# Patient Record
Sex: Male | Born: 1955
Health system: Southern US, Community
[De-identification: ages and names within clinical notes are randomized; demographics above are authoritative.]

## PROBLEM LIST (undated history)

## (undated) DIAGNOSIS — K2970 Gastritis, unspecified, without bleeding: Secondary | ICD-10-CM

## (undated) DIAGNOSIS — E119 Type 2 diabetes mellitus without complications: Secondary | ICD-10-CM

## (undated) DIAGNOSIS — I10 Essential (primary) hypertension: Secondary | ICD-10-CM

## (undated) DIAGNOSIS — A048 Other specified bacterial intestinal infections: Secondary | ICD-10-CM

## (undated) DIAGNOSIS — R197 Diarrhea, unspecified: Secondary | ICD-10-CM

## (undated) DIAGNOSIS — K219 Gastro-esophageal reflux disease without esophagitis: Secondary | ICD-10-CM

## (undated) HISTORY — DX: Diarrhea, unspecified: R19.7

## (undated) HISTORY — DX: Type 2 diabetes mellitus without complications: E11.9

## (undated) HISTORY — DX: Other specified bacterial intestinal infections: A04.8

## (undated) HISTORY — DX: Gastritis, unspecified, without bleeding: K29.70

## (undated) HISTORY — DX: Gastro-esophageal reflux disease without esophagitis: K21.9

---

## 2001-03-01 ENCOUNTER — Ambulatory Visit (HOSPITAL_BASED_OUTPATIENT_CLINIC_OR_DEPARTMENT_OTHER): Admission: RE | Admit: 2001-03-01 | Discharge: 2001-03-01 | Payer: Self-pay | Admitting: Otolaryngology

## 2005-08-25 ENCOUNTER — Emergency Department (HOSPITAL_COMMUNITY): Admission: EM | Admit: 2005-08-25 | Discharge: 2005-08-25 | Payer: Self-pay | Admitting: Emergency Medicine

## 2006-12-09 ENCOUNTER — Observation Stay (HOSPITAL_COMMUNITY): Admission: EM | Admit: 2006-12-09 | Discharge: 2006-12-10 | Payer: Self-pay | Admitting: Emergency Medicine

## 2008-04-23 ENCOUNTER — Encounter: Admission: RE | Admit: 2008-04-23 | Discharge: 2008-04-23 | Payer: Self-pay | Admitting: Otolaryngology

## 2010-07-12 NOTE — H&P (Signed)
Manuel Tyler, Manuel Tyler                 ACCOUNT NO.:  [id number]   MEDICAL RECORD NO.:  [id number]           PATIENT TYPE:   LOCATION:                                 FACILITY:   PHYSICIAN:  Kela Millin, M.D.DATE OF BIRTH:  October 14, 1955   DATE OF ADMISSION:  DATE OF DISCHARGE:                              HISTORY & PHYSICAL   PRIMARY CARE PHYSICIAN:  Dr. Janey Greaser.   CHIEF COMPLAINT:  Chest pain.   HISTORY OF PRESENT ILLNESS:  The patient is a 55 year old [place]  pleasant male with past medical history significant for hypertension,  who presents with complaints of chest pain.  He states that he was in  his usual state of health until about 8 o'clock last p.m. when he began  having chest pain.  He describes the pain as pressure of moderate  severity, left precordial and, it was intermittent.  He reports about  five episodes, each lasting about 30 minutes.  He admits to associated  shortness of breath.  He denies nausea and vomiting, diaphoresis,  radiation, and no paresthesias.  In the ER he was given aspirin and  nitroglycerin and he was not having any further pain at the time of my  examination.  The patient had point of care markers in the ER which were  negative, and EKG showed normal sinus rhythm with sinus arrhythmia, and  a chest x-ray showed bibasilar atelectasis with low lung volumes,  otherwise negative.  He is admitted for further evaluation and  management.   PAST MEDICAL HISTORY:  As above.   MEDICATIONS:  Metoprolol half a tablet b.i.d.   ALLERGIES:  No known drug allergies.   SOCIAL HISTORY:  He denies tobacco, also denies alcohol, and no illicit  drug use.   FAMILY HISTORY:  His sister had colon cancer at age 29.  No family  history of premature heart disease.   REVIEW OF SYSTEMS:  As per HPI.  Other review of systems negative.   LABORATORY DATA:  Chest X-Ray, EKG and point of care markers as per HPI.  Sodium is 137 with a potassium of 4.0, chloride  103, BUN of 14, glucose  96.  His creatinine is 1.0.  His pH is 7.39 with a pCO2 of 43.5.  white  cell count 8.1, hemoglobin 15.4, hematocrit of 44.5, platelet count of  181, neutrophil count 63%.   ASSESSMENT AND PLAN:  1. Chest pain:  As discussed above, will obtain serial cardiac enzymes      to evaluate for MI.  Will also obtain a D-      dimer, follow and further evaluate as appropriate.  Will place the      patient on aspirin, nitrates, continue metoprolol.  Will obtain a      fasting lipid profile in a.m. and follow.  2. Hypertension:  Continue metoprolol.      Kela Millin, M.D.  Electronically Signed     ACV/MEDQ  D:  12/09/2006  T:  12/10/2006  Job:  [id number]   cc:   Dr. Janey Greaser

## 2010-07-12 NOTE — H&P (Signed)
NAMERENLY, ROOTS                 ACCOUNT NO.:  [id number]   MEDICAL RECORD NO.:  [id number]           PATIENT TYPE:   LOCATION:                                 FACILITY:   PHYSICIAN:  Kela Millin, M.D.DATE OF BIRTH:  [birth year]   DATE OF ADMISSION:  DATE OF DISCHARGE:                              HISTORY & PHYSICAL   PRIMARY CARE PHYSICIAN:  Dr. Janey Greaser.   CHIEF COMPLAINT:  Chest pain.   HISTORY OF PRESENT ILLNESS:  The patient is a 55 year old [place]  pleasant male with past medical history significant for hypertension,  who presents with complaints of chest pain.  He states that he was in  his usual state of health until about 8 o'clock last p.m. when he began  having chest pain.  He describes the pain as pressure of moderate  severity, left precordial and, it was intermittent.  He reports about  five episodes, each lasting about 30 minutes.  He admits to associated  shortness of breath.  He denies nausea and vomiting, diaphoresis,  radiation, and no paresthesias.  In the ER he was given aspirin and  nitroglycerin and he was not having any further pain at the time of my  examination.  The patient had point of care markers in the ER which were  negative, and EKG showed normal sinus rhythm with sinus arrhythmia, and  a chest x-ray showed bibasilar atelectasis with low lung volumes,  otherwise negative.  He is admitted for further evaluation and  management.   PAST MEDICAL HISTORY:  As above.   MEDICATIONS:  Metoprolol half a tablet b.i.d.   ALLERGIES:  No known drug allergies.   SOCIAL HISTORY:  He denies tobacco, also denies alcohol, and no illicit  drug use.   FAMILY HISTORY:  His sister had colon cancer at age 77.  No family  history of premature heart disease.   REVIEW OF SYSTEMS:  As per HPI.  Other review of systems negative.   LABORATORY DATA:  Chest X-Ray, EKG and point of care markers as per HPI.  Sodium is 137 with a potassium of 4.0, chloride  103, BUN of 14, glucose  96.  His creatinine is 1.0.  His pH is 7.39 with a pCO2 of 43.5.  white  cell count 8.1, hemoglobin 15.4, hematocrit of 44.5, platelet count of  181, neutrophil count 63%.   ASSESSMENT AND PLAN:  1. Chest pain:  As discussed above, will obtain serial cardiac enzymes      to evaluate for MI.  Will also obtain a D-      dimer, follow and further evaluate as appropriate.  Will place the      patient on aspirin, nitrates, continue metoprolol.  Will obtain a      fasting lipid profile in a.m. and follow.  2. Hypertension:  Continue metoprolol.      Kela Millin, M.D.     ACV/MEDQ  D:  12/09/2006  T:  12/10/2006  Job:  [id number]   cc:   Dr. Janey Greaser

## 2010-07-15 NOTE — Op Note (Signed)
[facility]  Patient:    Manuel Tyler, Manuel Tyler Visit Number: [id number] MRN: [id number]          Service Type: DSU Location: [facility] Attending Physician:  Carlean Purl Dictated by:   Kristine Garbe Ezzard Standing, M.D. Proc. Date: 03/01/01 Admit Date:  03/01/2001 Discharge Date: 03/01/2001                             Operative Report  PREOPERATIVE DIAGNOSIS: 1. Septal deformity with nasal obstruction. 2. Turbinate hypertrophy with nasal obstruction.  POSTOPERATIVE DIAGNOSIS: 1. Septal deformity with nasal obstruction. 2. Turbinate hypertrophy with nasal obstruction.  OPERATION: Septoplasty and turbinate reduction.  ANESTHESIA: General endotracheal.  COMPLICATIONS: None.  BRIEF CLINICAL NOTE: [name] is a 55 year old gentleman who has had chronic nasal obstruction. He has repeatedly used a decongestant nasal spray in order to breath.  On examination, he has large, swollen turbinates, as well as a septal deviation.  He is taken to the operating room at this time for septoplasty and turbinate reductions.  DESCRIPTION OF PROCEDURE: After adequate endotracheal anesthesia, the patients nose was prepped with Betadine solution and draped out with sterile towels.  The nose was then further prepped with cotton pledgets soaked in decongestant solution, and the septum and turbinates were injected with Xylocaine with epinephrine for hemostasis.  A hemitransfixion incision was made along the septum on the right side.  The mucoperichondrial and mucoperiosteal flaps were elevated posteriorly.  The patient had bowing of the cartilaginous septum, as well as the bony septum protruding into the left airway.  The excess cartilaginous septum, as well as the bony protruding septum was removed after elevating the mucoperichondrial and mucoperiosteal flaps bilaterally.  This allowed the septum to return more to the midline. Following this, the turbinates were  reduced bilaterally.  The inferior one-third of the turbinate was amputated, and then bipolar cautery was used to perform submucosal cauterization, and the remaining turbinate tissue was out-fractured.  The hemitransfixion incision was closed with 4-0 chromic sutures.  The plastic splints were secured to either side of the septum with a single 3-0 nylon suture.  The nose was then placed with Gelfoam soaked in Bacitracin ointment.  Kees tolerated the procedure well and was awakened from anesthesia and transferred to the recovery room postoperatively doing well. Of note, the patient received 1 gm of Ancef IV preoperatively.  DISPOSITION:  Jadan is discharged home later this morning on Tylenol and Tylenol #3 p.r.n. pain, Keflex 500 mg b.i.d. for 1 week.  We will have him follow up in my office in one week for recheck and have his septal splints removed. Dictated by:   Kristine Garbe Ezzard Standing, M.D. Attending Physician:  Carlean Purl DD:  04/10/01 TD:  04/10/01 Job: [id number]

## 2010-12-08 LAB — LIPID PANEL
Cholesterol: 185
HDL: 27 — ABNORMAL LOW
LDL Cholesterol: 111 — ABNORMAL HIGH

## 2010-12-08 LAB — I-STAT 8, (EC8 V) (CONVERTED LAB)
Acid-Base Excess: 1
Bicarbonate: 26.5 — ABNORMAL HIGH
Chloride: 103
Potassium: 4
pCO2, Ven: 43.5 — ABNORMAL LOW

## 2010-12-08 LAB — POCT I-STAT CREATININE: Creatinine, Ser: 1

## 2010-12-08 LAB — DIFFERENTIAL
Basophils Absolute: 0
Basophils Relative: 0
Eosinophils Relative: 5
Monocytes Absolute: 0.7
Monocytes Relative: 9
Neutro Abs: 5.1

## 2010-12-08 LAB — CARDIAC PANEL(CRET KIN+CKTOT+MB+TROPI)
CK, MB: 1.6
Relative Index: 1.6
Total CK: 113
Troponin I: <0.01

## 2010-12-08 LAB — POCT CARDIAC MARKERS: Myoglobin, poc: 57.1

## 2010-12-08 LAB — CBC
HCT: 44.5
Hemoglobin: 15.4
MCHC: 34.6
Platelets: 181

## 2010-12-08 LAB — TROPONIN I: Troponin I: <0.01

## 2010-12-08 LAB — CK TOTAL AND CKMB (NOT AT ARMC): CK, MB: 2.3

## 2012-05-24 ENCOUNTER — Observation Stay (HOSPITAL_COMMUNITY)
Admission: EM | Admit: 2012-05-24 | Discharge: 2012-05-25 | Disposition: A | Discharge status: Home / Self Care | Payer: Self-pay | Attending: Internal Medicine | Admitting: Internal Medicine

## 2012-05-24 ENCOUNTER — Emergency Department (HOSPITAL_COMMUNITY): Payer: Self-pay

## 2012-05-24 ENCOUNTER — Encounter (HOSPITAL_COMMUNITY): Payer: Self-pay

## 2012-05-24 DIAGNOSIS — G459 Transient cerebral ischemic attack, unspecified: Secondary | ICD-10-CM

## 2012-05-24 DIAGNOSIS — H538 Other visual disturbances: Secondary | ICD-10-CM | POA: Insufficient documentation

## 2012-05-24 DIAGNOSIS — R29898 Other symptoms and signs involving the musculoskeletal system: Secondary | ICD-10-CM | POA: Insufficient documentation

## 2012-05-24 DIAGNOSIS — R200 Anesthesia of skin: Secondary | ICD-10-CM

## 2012-05-24 LAB — CBC WITH DIFFERENTIAL/PLATELET
Eosinophils Absolute: 0.1 10*3/uL (ref 0.0–0.7)
Eosinophils Relative: 2 % (ref 0–5)
Hemoglobin: 14.9 g/dL (ref 13.0–17.0)
Lymphs Abs: 1.8 10*3/uL (ref 0.7–4.0)
MCH: 31.6 pg (ref 26.0–34.0)
MCHC: 36.2 g/dL — ABNORMAL HIGH (ref 30.0–36.0)
Neutro Abs: 4.4 10*3/uL (ref 1.7–7.7)
Neutrophils Relative %: 66 % (ref 43–77)
RDW: 12.5 % (ref 11.5–15.5)
WBC: 6.7 10*3/uL (ref 4.0–10.5)

## 2012-05-24 LAB — POCT I-STAT TROPONIN I

## 2012-05-24 LAB — RAPID URINE DRUG SCREEN, HOSP PERFORMED
Amphetamines: NOT DETECTED
Barbiturates: NOT DETECTED
Benzodiazepines: NOT DETECTED
Cocaine: NOT DETECTED
Opiates: NOT DETECTED
Tetrahydrocannabinol: NOT DETECTED

## 2012-05-24 LAB — BASIC METABOLIC PANEL
Chloride: 101 mEq/L (ref 96–112)
GFR calc Af Amer: >90 mL/min (ref >90)
GFR calc non Af Amer: >90 mL/min (ref >90)
Potassium: 3.9 mEq/L (ref 3.5–5.1)

## 2012-05-24 LAB — TROPONIN I: Troponin I: <0.3 ng/mL (ref <0.30)

## 2012-05-24 MED ORDER — ASPIRIN EC 81 MG PO TBEC
81.0000 mg | DELAYED_RELEASE_TABLET | Freq: Every day | ORAL | Status: DC
  Administered 2012-05-24 – 2012-05-25 (×2): 81 mg via ORAL
  Filled 2012-05-24 (×2): qty 1

## 2012-05-24 MED ORDER — ENOXAPARIN SODIUM 40 MG/0.4ML ~~LOC~~ SOLN
40.0000 mg | SUBCUTANEOUS | Status: DC
  Administered 2012-05-24: 40 mg via SUBCUTANEOUS
  Filled 2012-05-24 (×2): qty 0.4

## 2012-05-24 NOTE — ED Notes (Addendum)
Per EMS: Pt at church and had sudden onset of left foot heaviness, tingling and 4th and 5th finger numbness and blurred vision in left eye at 1320. Pt fell to ground. No LOC. Pt given nitro by friend, which he states help alleviate the syx. Syx lasted appx 30 minutes before completely resolving. Complaint free at this time. Ambulates with no difficulty, denies blurred vision, AO x4. Pt went to PCP, sent here for further evaluation. VSS. 119/69. 73 SR. 100% RA. 94 BG.

## 2012-05-24 NOTE — ED Provider Notes (Signed)
History     CSN: [id number]  Arrival date & time 05/24/12  1529   First MD Initiated Contact with Patient 05/24/12 1544      Chief Complaint  Patient presents with  . Extremity Weakness    (Consider location/radiation/quality/duration/timing/severity/associated sxs/prior treatment) HPI Comments: Patient is a 57 year old male with a past medical history of hypertension who presents with an episode of extremity weakness that occurred earlier this afternoon. He reports sitting in church and having sudden onset of left foot and left hand heaviness, numbness and tingling. Patient reports he needed assistance walking at this time even though he usually walks without assistance. The symptoms lasted about 30 minutes before resolving after he took sublingual nitroglycerin. Patient reports associated blurred vision bilaterally during the episode. After symptoms resolved, patient's son took him to [facility], where his PCP is located, who then directed him to the ED. No aggravating factors.   Patient is a 57 y.o. male presenting with extremity weakness.  Extremity Weakness Associated symptoms include numbness and weakness.    History reviewed. No pertinent past medical history.  History reviewed. No pertinent past surgical history.  History reviewed. No pertinent family history.  History  Substance Use Topics  . Smoking status: Never Smoker   . Smokeless tobacco: Not on file  . Alcohol Use: No      Review of Systems  Musculoskeletal: Positive for extremity weakness.  Neurological: Positive for weakness and numbness.  All other systems reviewed and are negative.    Allergies  Review of patient's allergies indicates no known allergies.  Home Medications  No current outpatient prescriptions on file.  BP 111/63  Pulse 70  Temp(Src) 98 F (36.7 C) (Oral)  Resp 15  SpO2 100%  Physical Exam  Nursing note and vitals reviewed. Constitutional: He is oriented to person,  place, and time. He appears well-developed and well-nourished. No distress.  HENT:  Head: Normocephalic and atraumatic.  Mouth/Throat: Oropharynx is clear and moist. No oropharyngeal exudate.  Eyes: Conjunctivae and EOM are normal. Pupils are equal, round, and reactive to light. No scleral icterus.  Neck: Normal range of motion. Neck supple.  Cardiovascular: Normal rate and regular rhythm.  Exam reveals no gallop and no friction rub.   No murmur heard. Pulmonary/Chest: Effort normal and breath sounds normal. He has no wheezes. He has no rales. He exhibits no tenderness.  Abdominal: Soft. He exhibits no distension. There is no tenderness. There is no rebound and no guarding.  Musculoskeletal: Normal range of motion.  Neurological: He is alert and oriented to person, place, and time. No cranial nerve deficit. Coordination normal.  Extremity strength and sensation equal and intact bilaterally. Speech is goal-oriented. Moves limbs without ataxia.   Skin: Skin is warm and dry.  Psychiatric: He has a normal mood and affect. His behavior is normal.    ED Course  Procedures (including critical care time)   Date: 05/24/2012  Rate: 69  Rhythm: normal sinus rhythm  QRS Axis: normal  Intervals: normal  ST/T Wave abnormalities: normal  Conduction Disutrbances:none  Narrative Interpretation: NSR  Old EKG Reviewed: none available    Labs Reviewed  CBC WITH DIFFERENTIAL - Abnormal; Notable for the following:    MCHC 36.2 (*)    All other components within normal limits  BASIC METABOLIC PANEL  POCT I-STAT TROPONIN I   Ct Head Wo Contrast  05/24/2012  *RADIOLOGY REPORT*  Clinical Data: Extremity weakness. The nurse's note in emergency department states left foot  heaviness.  Blurred vision in the left eye.  CT HEAD WITHOUT CONTRAST  Technique:  Contiguous axial images were obtained from the base of the skull through the vertex without contrast.  Comparison: None.  Findings: Focal hypoattenuation  within the anterior limb of the left internal capsule may represent an age indeterminate infarct. This does not correspond with the lower left lower extremity symptoms.  No acute cortical infarct is present otherwise.  No hemorrhage or mass lesion is present.  The ventricles are of normal size.  No significant extra-axial fluid collection is present.  Mild mucosal thickening is present in the ethmoid air cells and inferior left frontal sinus.  The paranasal sinuses and mastoid air cells are otherwise clear.  IMPRESSION:  1.  Focal hypoattenuation within the anterior limb of the left internal capsule may be related to ischemia but does not correspond to the patient's acute symptoms. 2.  No other acute intracranial abnormality. 3.  Minimal left ethmoid and inferior left frontal sinus disease.   Original Report Authenticated By: Marin Roberts, M.D.      1. TIA (transient ischemic attack)       MDM  4:02 PM Labs and head CT pending.   6:14 PM CT concerning for area of focal hypoattenuation, possible ischemia. Patient will be admitted for TIA work up by Dr. Sharl Ma.       Emilia Beck, PA-C 05/24/12 [address] [place], PA-C 05/24/12 2118

## 2012-05-24 NOTE — H&P (Addendum)
PCP:   Pcp Not In System   Chief Complaint:  Left leg weakness  HPI: 69 with no significant past medical history, came to the ED after he had an episode of left leg weakness along with numbness in the little and ring finger, blurred vision which lasted for 20 minutes. Patient was [place], when this episode occurred. Patient denies slurred speech, no history of passing out, no seizure. He denies chest pain, shortness of breath, nausea vomiting or diarrhea. Patient says that he ate breakfast this morning.  Allergies:  No Known Allergies Past medical history No significant past history  Family history Patient's father has history of strokes   Prior to Admission medications   Not on File    Social History:  reports that he has never smoked. He does not have any smokeless tobacco history on file. He reports that he does not drink alcohol. His drug history is not on file.  History reviewed. No pertinent family history.  Review of Systems:  HEENT: Denies headache, , runny nose, sore throat,  Neck: Denies thyroid problems,lymphadenopathy Chest : Denies shortness of breath, no history of COPD Heart : Denies Chest pain,  coronary arterey disease GI: Denies  nausea, vomiting, diarrhea, constipation GU: Denies dysuria, urgency, frequency of urination, hematuria Neuro: Denies stroke, seizures, syncope Psych: Denies depression, anxiety, hallucinations   Physical Exam: Blood pressure 133/86, pulse 66, temperature 98 F (36.7 C), temperature source Oral, resp. rate 14, SpO2 96.00%. Constitutional:   Patient is a well-developed and well-nourished male  in no acute distress and cooperative with exam. Head: Normocephalic and atraumatic Mouth: Mucus membranes moist Eyes: PERRL, EOMI, conjunctivae normal Neck: Supple, No Thyromegaly Cardiovascular: RRR, S1 normal, S2 normal Pulmonary/Chest: CTAB, no wheezes, rales, or rhonchi Abdominal: Soft. Non-tender, non-distended, bowel  sounds are normal, no masses, organomegaly, or guarding present.  Neurological: A&O x3, Strenght is normal and symmetric bilaterally, cranial nerve II-XII are grossly intact, no focal motor deficit, sensory intact to light touch bilaterally.  Extremities : No Cyanosis, Clubbing or Edema   Labs on Admission:  Results for orders placed during the hospital encounter of 05/24/12 (from the past 48 hour(s))  CBC WITH DIFFERENTIAL     Status: Abnormal   Collection Time    05/24/12  4:39 PM      Result Value Range   WBC 6.7  4.0 - 10.5 K/uL   RBC 4.71  4.22 - 5.81 MIL/uL   Hemoglobin 14.9  13.0 - 17.0 g/dL   HCT 45.4  09.8 - 11.9 %   MCV 87.5  78.0 - 100.0 fL   MCH 31.6  26.0 - 34.0 pg   MCHC 36.2 (*) 30.0 - 36.0 g/dL   RDW 14.7  82.9 - 56.2 %   Platelets 167  150 - 400 K/uL   Neutrophils Relative 66  43 - 77 %   Neutro Abs 4.4  1.7 - 7.7 K/uL   Lymphocytes Relative 27  12 - 46 %   Lymphs Abs 1.8  0.7 - 4.0 K/uL   Monocytes Relative 5  3 - 12 %   Monocytes Absolute 0.4  0.1 - 1.0 K/uL   Eosinophils Relative 2  0 - 5 %   Eosinophils Absolute 0.1  0.0 - 0.7 K/uL   Basophils Relative 1  0 - 1 %   Basophils Absolute 0.0  0.0 - 0.1 K/uL  BASIC METABOLIC PANEL     Status: None   Collection Time  05/24/12  4:39 PM      Result Value Range   Sodium 137  135 - 145 mEq/L   Potassium 3.9  3.5 - 5.1 mEq/L   Chloride 101  96 - 112 mEq/L   CO2 25  19 - 32 mEq/L   Glucose, Bld 88  70 - 99 mg/dL   BUN 13  6 - 23 mg/dL   Creatinine, Ser 1.61  0.50 - 1.35 mg/dL   Calcium 9.1  8.4 - 09.6 mg/dL   GFR calc non Af Amer >90  >90 mL/min   GFR calc Af Amer >90  >90 mL/min   Comment:            The eGFR has been calculated     using the CKD EPI equation.     This calculation has not been     validated in all clinical     situations.     eGFR's persistently     <90 mL/min signify     possible Chronic Kidney Disease.  POCT I-STAT TROPONIN I     Status: None   Collection Time    05/24/12  5:10 PM       Result Value Range   Troponin i, poc 0.00  0.00 - 0.08 ng/mL   Comment 3            Comment: Due to the release kinetics of cTnI,     a negative result within the first hours     of the onset of symptoms does not rule out     myocardial infarction with certainty.     If myocardial infarction is still suspected,     repeat the test at appropriate intervals.    Radiological Exams on Admission: Ct Head Wo Contrast  05/24/2012  *RADIOLOGY REPORT*  Clinical Data: Extremity weakness. The nurse's note in emergency department states left foot heaviness.  Blurred vision in the left eye.  CT HEAD WITHOUT CONTRAST  Technique:  Contiguous axial images were obtained from the base of the skull through the vertex without contrast.  Comparison: None.  Findings: Focal hypoattenuation within the anterior limb of the left internal capsule may represent an age indeterminate infarct. This does not correspond with the lower left lower extremity symptoms.  No acute cortical infarct is present otherwise.  No hemorrhage or mass lesion is present.  The ventricles are of normal size.  No significant extra-axial fluid collection is present.  Mild mucosal thickening is present in the ethmoid air cells and inferior left frontal sinus.  The paranasal sinuses and mastoid air cells are otherwise clear.  IMPRESSION:  1.  Focal hypoattenuation within the anterior limb of the left internal capsule may be related to ischemia but does not correspond to the patient's acute symptoms. 2.  No other acute intracranial abnormality. 3.  Minimal left ethmoid and inferior left frontal sinus disease.   Original Report Authenticated By: Marin Roberts, M.D.     Assessment/Plan  TIA We'll admit the patient for workup of TIA under telemetry Will obtain 2-D echo, carotid Doppler Fasting lipid profile, hemoglobin A1c We'll also MRI and MRA of the brain  Left hand numbness We will obtain 3 sets of cardiac enzymes   DVT  prophylaxis Lovenox  EKG reviewed: NSR, No abnormality  Time Spent on Admission: 55 min  LAMA,GAGAN S [facility] Pager: [phone] 05/24/2012, 6:39 PM

## 2012-05-25 ENCOUNTER — Observation Stay (HOSPITAL_COMMUNITY): Payer: Self-pay

## 2012-05-25 DIAGNOSIS — R209 Unspecified disturbances of skin sensation: Secondary | ICD-10-CM

## 2012-05-25 LAB — LIPID PANEL
Cholesterol: 204 mg/dL — ABNORMAL HIGH (ref 0–200)
Total CHOL/HDL Ratio: 5.8 RATIO

## 2012-05-25 LAB — HEMOGLOBIN A1C
Hgb A1c MFr Bld: 5.6 % (ref <5.7)
Mean Plasma Glucose: 114 mg/dL (ref <117)

## 2012-05-25 LAB — TROPONIN I
Troponin I: <0.3 ng/mL (ref <0.30)
Troponin I: <0.3 ng/mL (ref <0.30)

## 2012-05-25 MED ORDER — ATORVASTATIN CALCIUM 40 MG PO TABS: 40.0000 mg | ORAL_TABLET | Freq: Every day | ORAL | Status: DC

## 2012-05-25 MED ORDER — ASPIRIN 81 MG PO TBEC: 81.0000 mg | DELAYED_RELEASE_TABLET | Freq: Every day | ORAL | Status: DC

## 2012-05-25 NOTE — Progress Notes (Signed)
  Echocardiogram 2D Echocardiogram has been performed.  Jorje Guild 05/25/2012, 2:31 PM

## 2012-05-25 NOTE — ED Provider Notes (Signed)
Shared service with midlevel provider. I have personally seen and examined the patient, providing direct face to face care, presenting with the chief complaint of numbness. Physical exam findings include non focal neuro exam. Pt's ABCD2 score is 3. Plan will be to admit for TIA workup. I have reviewed the nursing documentation on past medical history, family history, and social history.  Derwood Kaplan, MD 05/25/12 2111

## 2012-05-25 NOTE — Progress Notes (Signed)
VASCULAR LAB PRELIMINARY  PRELIMINARY  PRELIMINARY  PRELIMINARY  Carotid Dopplers completed.    Preliminary report:  There is no ICA stenosis.  Vertebral artery flow is antegrade.  Armaan Pond, RVT 05/25/2012, 3:10 PM

## 2012-05-25 NOTE — Progress Notes (Signed)
Patient c/o intermittent numbness to left toes only that lasts for a very short time and then goes away. Alerted patient that I would leave note to alert MD. Will continue to monitor.

## 2012-05-25 NOTE — Discharge Summary (Addendum)
Physician Discharge Summary  Manuel Tyler RUE:[id number] DOB: 1955/12/07 DOA: 05/24/2012  PCP: Pcp Not In System  Admit date: 05/24/2012 Discharge date: 05/25/2012  Time spent: 35 minutes  Recommendations for Outpatient Follow-up:  1. Please followup with her primary care provider next week.  Discharge Diagnoses:  Active Problems:   TIA (transient ischemic attack)  Discharge Condition: stable  Diet recommendation: heart healthy  Filed Weights   05/24/12 1933  Weight: 72 kg (158 lb 11.7 oz)   History of present illness:  19 with no significant past medical history, came to the ED after he had an episode of left leg weakness along with numbness in the little and ring finger, blurred vision which lasted for 20 minutes. Patient [place], when this episode occurred. Patient denies slurred speech, no history of passing out, no seizure. He denies chest pain, shortness of breath, nausea vomiting or diarrhea. Patient says that he ate breakfast this morning.  Hospital Course:  Patient was admitted for a TIA workup. When I interviewed the patient this morning, he says that his main numbness was in his left toes and they come and go. He underwent an MRI which showed no evidence for acute or subacute infarction however it does show ischemic changes compatible with small vessel disease. MRA showed no significant proximal stenosis. He underwent a TTE, and the results are pending at the time of this dictation. He underwent carotid duplex which was negative for stenosis. Lipid panel did show that he has elevated LDL and patient was started on a statin on discharge. He is also to start aspirin daily. Patient insisted that he be discharged today because has family coming in town. With a negative MRI, negative carotid dopplers and currently no symptoms, I will followup on the TTE once the results are back in. He was instructed to followup with his primary care provider early next  week.  Procedures:  2D echo  Consultations:  none  Discharge Exam: Filed Vitals:   05/24/12 2330 05/25/12 0256 05/25/12 0620 05/25/12 1109  BP: 129/61 126/76 117/76 128/86  Pulse: 75 60 69 61  Temp: 97.8 F (36.6 C) 97.7 F (36.5 C) 97.7 F (36.5 C) 98 F (36.7 C)  TempSrc: Oral Oral Oral Oral  Resp: 20 16 18 16   Height:      Weight:      SpO2: 99% 100% 100% 98%   General: NAD Cardiovascular: RRR Respiratory: CTA biL  Discharge Instructions     Medication List    TAKE these medications       aspirin 81 MG EC tablet  Take 1 tablet (81 mg total) by mouth daily.     atorvastatin 40 MG tablet  Commonly known as:  LIPITOR  Take 1 tablet (40 mg total) by mouth daily.           Follow-up Information   Follow up with your primary care provider In 1 week.       The results of significant diagnostics from this hospitalization (including imaging, microbiology, ancillary and laboratory) are listed below for reference.    Significant Diagnostic Studies: Ct Head Wo Contrast  05/24/2012  *RADIOLOGY REPORT*  Clinical Data: Extremity weakness. The nurse's note in emergency department states left foot heaviness.  Blurred vision in the left eye.  CT HEAD WITHOUT CONTRAST  Technique:  Contiguous axial images were obtained from the base of the skull through the vertex without contrast.  Comparison: None.  Findings: Focal hypoattenuation within the anterior  limb of the left internal capsule may represent an age indeterminate infarct. This does not correspond with the lower left lower extremity symptoms.  No acute cortical infarct is present otherwise.  No hemorrhage or mass lesion is present.  The ventricles are of normal size.  No significant extra-axial fluid collection is present.  Mild mucosal thickening is present in the ethmoid air cells and inferior left frontal sinus.  The paranasal sinuses and mastoid air cells are otherwise clear.  IMPRESSION:  1.  Focal hypoattenuation  within the anterior limb of the left internal capsule may be related to ischemia but does not correspond to the patient's acute symptoms. 2.  No other acute intracranial abnormality. 3.  Minimal left ethmoid and inferior left frontal sinus disease.   Original Report Authenticated By: Marin Roberts, M.D.    Mri Brain Without Contrast  05/25/2012  *RADIOLOGY REPORT*  Clinical Data:  TIA.  Acute onset of left lower extremity weakness yesterday.  The patient also experienced numbness in the 90 and ring finger of the left hand and blurred vision.  The symptoms lasted 20 minutes and have resolved.  MRI HEAD WITHOUT CONTRAST MRA HEAD WITHOUT CONTRAST  Technique:  Multiplanar, multiecho pulse sequences of the brain and surrounding structures were obtained without intravenous contrast. Angiographic images of the head were obtained using MRA technique without contrast.  Comparison:  CT head without contrast 05/24/2012.  MRI HEAD  Findings:  T2 hyperintensities are present within the basal ganglia bilaterally.  There is asymmetric T2 signal within the anterior limb of the left internal capsule.  Additional multifocal punctate areas of subcortical white matter disease is noted bilaterally. There are foci within the external capsule bilaterally, right greater than left.  The diffusion weighted images demonstrate no evidence for acute or subacute infarction.  The ventricles are normal size.  No significant extra-axial fluid collection is present.  Polypoid disease is noted within the posterior left nasal cavity. Asymmetric ethmoid mucosal thickening is present on the left. Mucosal thickening of the frontal maxillary sinuses is also slightly worse on the left.  The mastoid air cells are clear.  IMPRESSION:  1.  Signal changes within the basal ganglia bilaterally as well as scattered throughout the white matter are most compatible with ischemic changes of small vessel disease. 2.  The CT abnormality corresponds with  asymmetric white matter disease within the anterior limb of the left internal capsule. 3.  Mild sinus disease as described.  MRA HEAD  Findings: The internal carotid arteries are within normal limits from high cervical segments through the ICA termini bilaterally. The A1 and M1 segments are normal.  The MCA bifurcations are within normal limits bilaterally.  This the MCA branch vessels demonstrate to moderate disease, right greater than left.  The vertebral arteries are codominant.  The PICA origins are visualized and within normal limits bilaterally.  The basilar artery is normal.  Both posterior cerebral arteries originate from the basilar tip.  There is some attenuation of distal PCA branch vessels.  IMPRESSION:  1.  Moderate distal small vessel disease. 2.  No significant proximal stenosis, aneurysm, or branch vessel occlusion.   Original Report Authenticated By: Marin Roberts, M.D.    Mr Mra Head/brain Wo Cm  05/25/2012  *RADIOLOGY REPORT*  Clinical Data:  TIA.  Acute onset of left lower extremity weakness yesterday.  The patient also experienced numbness in the 90 and ring finger of the left hand and blurred vision.  The symptoms lasted 20 minutes and have  resolved.  MRI HEAD WITHOUT CONTRAST MRA HEAD WITHOUT CONTRAST  Technique:  Multiplanar, multiecho pulse sequences of the brain and surrounding structures were obtained without intravenous contrast. Angiographic images of the head were obtained using MRA technique without contrast.  Comparison:  CT head without contrast 05/24/2012.  MRI HEAD  Findings:  T2 hyperintensities are present within the basal ganglia bilaterally.  There is asymmetric T2 signal within the anterior limb of the left internal capsule.  Additional multifocal punctate areas of subcortical white matter disease is noted bilaterally. There are foci within the external capsule bilaterally, right greater than left.  The diffusion weighted images demonstrate no evidence for acute or  subacute infarction.  The ventricles are normal size.  No significant extra-axial fluid collection is present.  Polypoid disease is noted within the posterior left nasal cavity. Asymmetric ethmoid mucosal thickening is present on the left. Mucosal thickening of the frontal maxillary sinuses is also slightly worse on the left.  The mastoid air cells are clear.  IMPRESSION:  1.  Signal changes within the basal ganglia bilaterally as well as scattered throughout the white matter are most compatible with ischemic changes of small vessel disease. 2.  The CT abnormality corresponds with asymmetric white matter disease within the anterior limb of the left internal capsule. 3.  Mild sinus disease as described.  MRA HEAD  Findings: The internal carotid arteries are within normal limits from high cervical segments through the ICA termini bilaterally. The A1 and M1 segments are normal.  The MCA bifurcations are within normal limits bilaterally.  This the MCA branch vessels demonstrate to moderate disease, right greater than left.  The vertebral arteries are codominant.  The PICA origins are visualized and within normal limits bilaterally.  The basilar artery is normal.  Both posterior cerebral arteries originate from the basilar tip.  There is some attenuation of distal PCA branch vessels.  IMPRESSION:  1.  Moderate distal small vessel disease. 2.  No significant proximal stenosis, aneurysm, or branch vessel occlusion.   Original Report Authenticated By: Marin Roberts, M.D.    Labs: Basic Metabolic Panel:  Recent Labs Lab 05/24/12 1639  NA 137  K 3.9  CL 101  CO2 25  GLUCOSE 88  BUN 13  CREATININE 0.90  CALCIUM 9.1   CBC:  Recent Labs Lab 05/24/12 1639  WBC 6.7  NEUTROABS 4.4  HGB 14.9  HCT 41.2  MCV 87.5  PLT 167   Cardiac Enzymes:  Recent Labs Lab 05/24/12 2037 05/25/12 0109 05/25/12 1057  TROPONINI <0.30 <0.30 <0.30    Signed:  Pamella Pert  [facility] 05/25/2012,  3:36 PM

## 2012-05-25 NOTE — Progress Notes (Signed)
UR completed 

## 2012-05-26 NOTE — Progress Notes (Signed)
No NCM needs identified. Pt did not request any assistance with medications. Isidoro Donning RN CCM Case Mgmt phone [phone]

## 2014-05-05 IMAGING — CT CT HEAD W/O CM
1 series · 15 of 30 positions shown, 19 images · non-contrast
Comparison: None.

CLINICAL DATA: Extremity weakness. The nurse's note in emergency
department states left foot heaviness.  Blurred vision in the left
eye.

CT HEAD WITHOUT CONTRAST
TECHNIQUE: Contiguous axial images were obtained from the base of
the skull through the vertex without contrast.

[Series 2: head routine 4.8 h37s · axial · 0.48mm/px · z∈[-94,+39]mm · 15 of 30 slices shown, 19 images]
[im 2/30  brain]
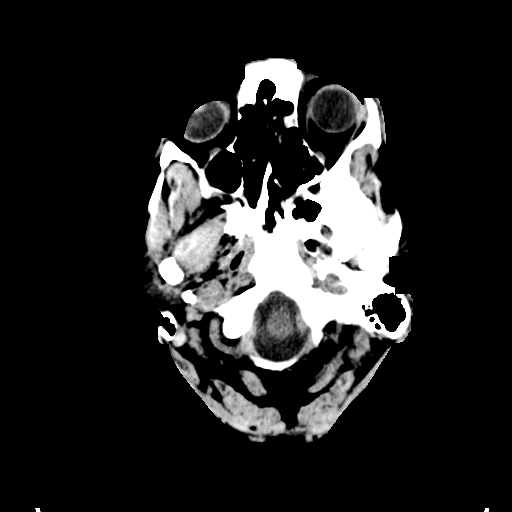
[im 2/30  bone]
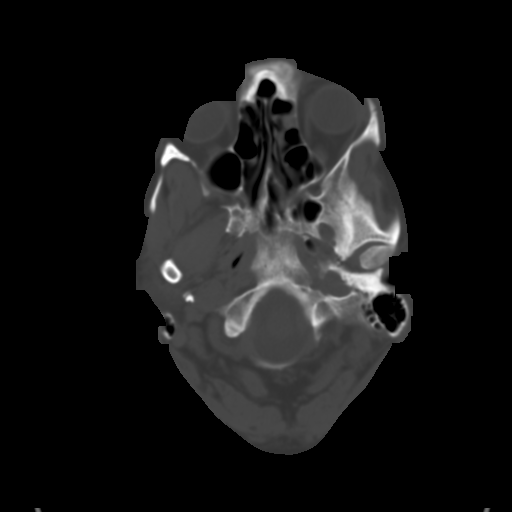
[im 4/30  brain]
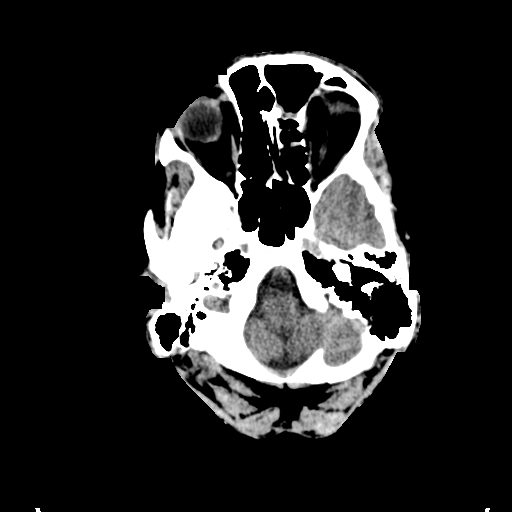
[im 6/30  brain]
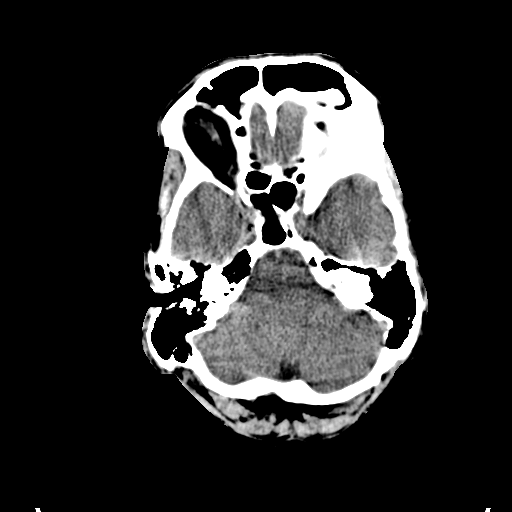
[im 8/30  brain]
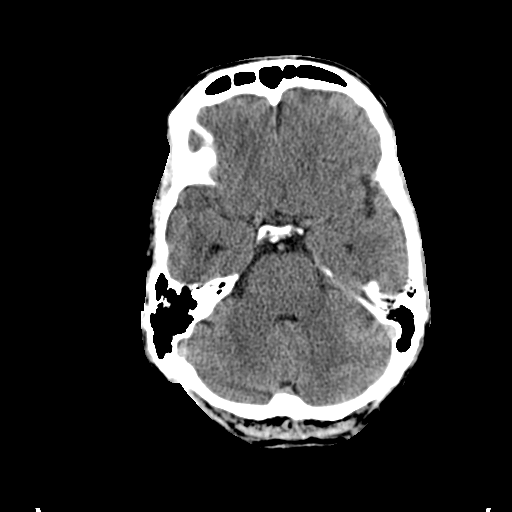
[im 10/30  brain]
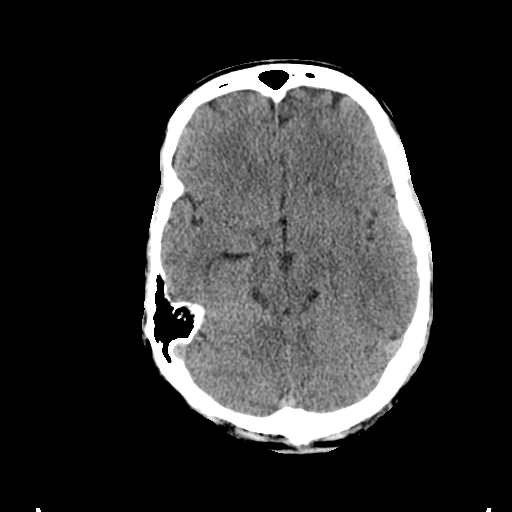
[im 10/30  bone]
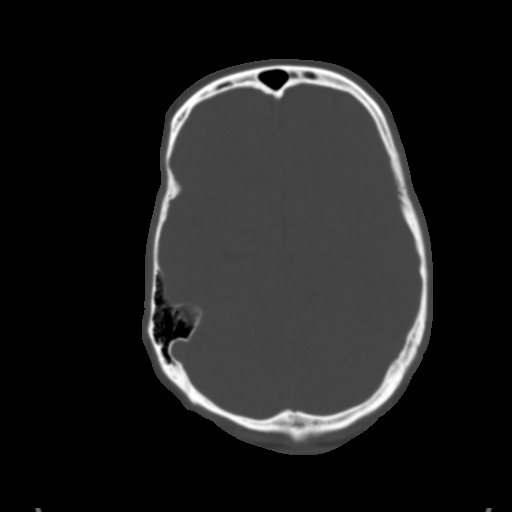
[im 12/30  brain]
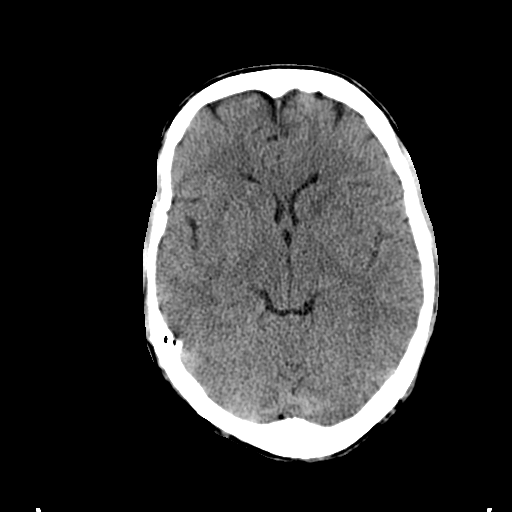
[im 14/30  brain]
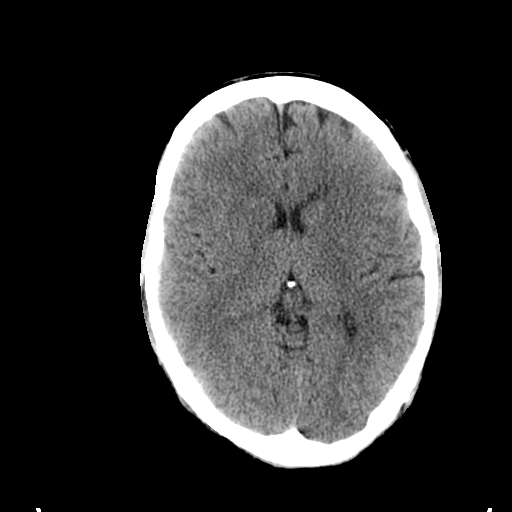
[im 16/30  brain]
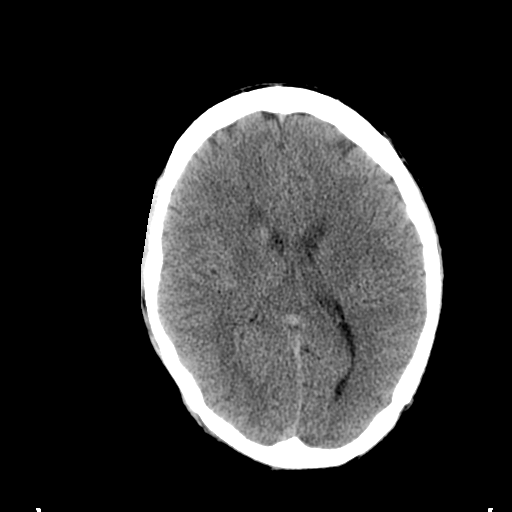
[im 17/30  brain]
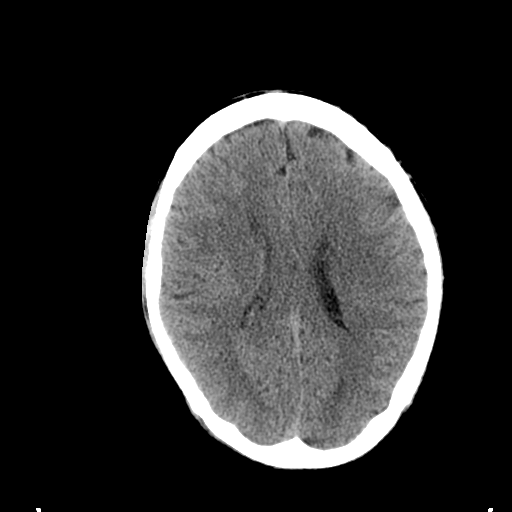
[im 17/30  bone]
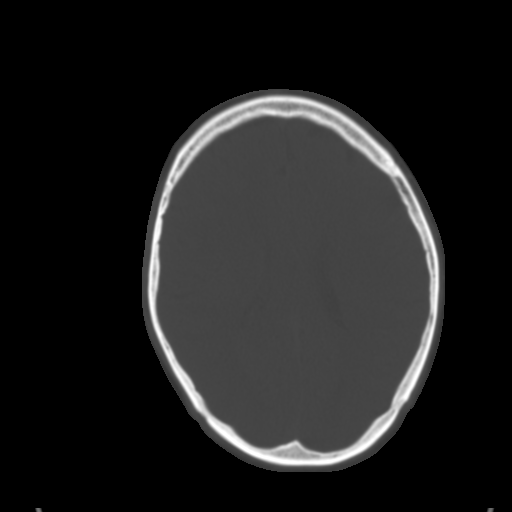
[im 19/30  brain]
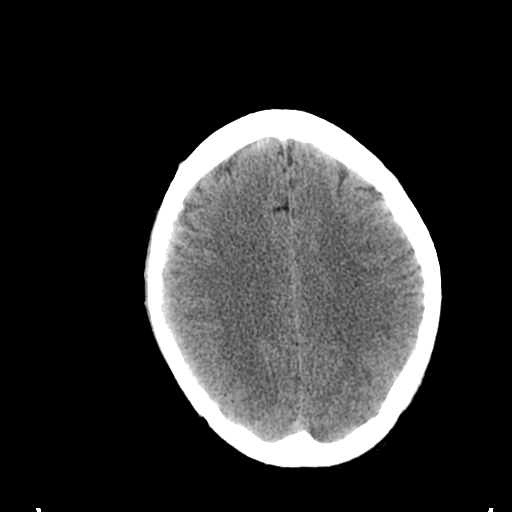
[im 21/30  brain]
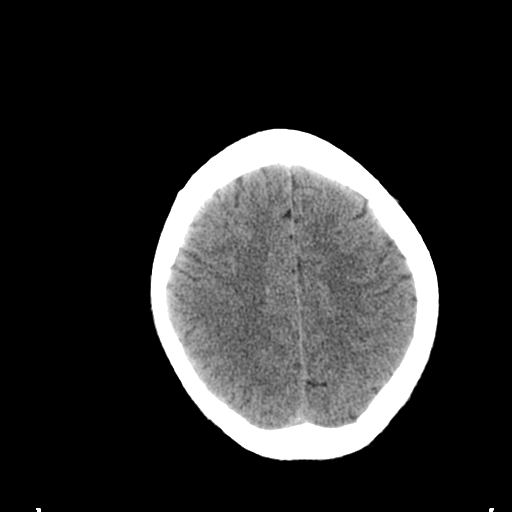
[im 23/30  brain]
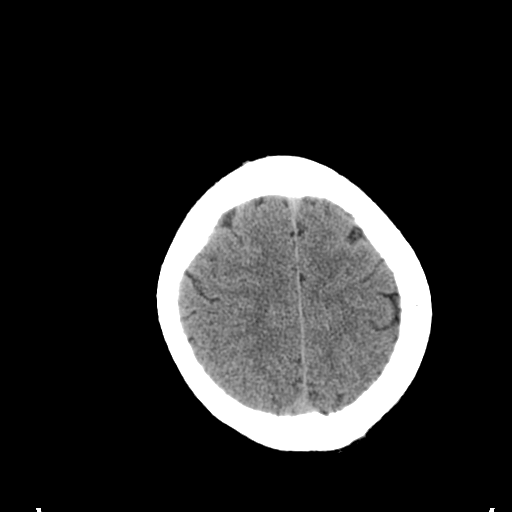
[im 25/30  brain]
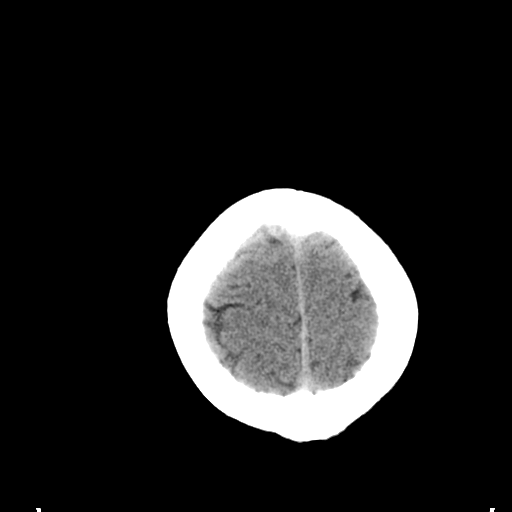
[im 25/30  bone]
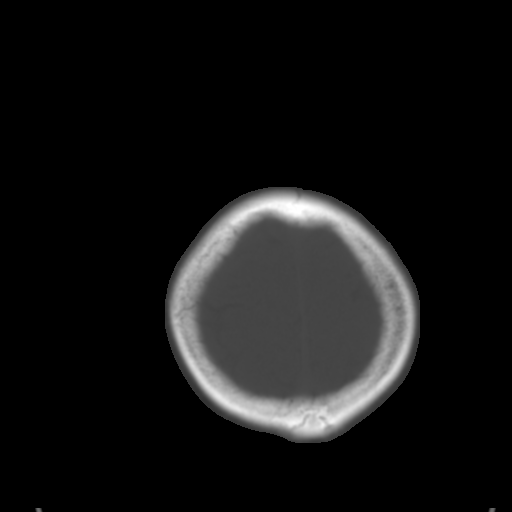
[im 27/30  brain]
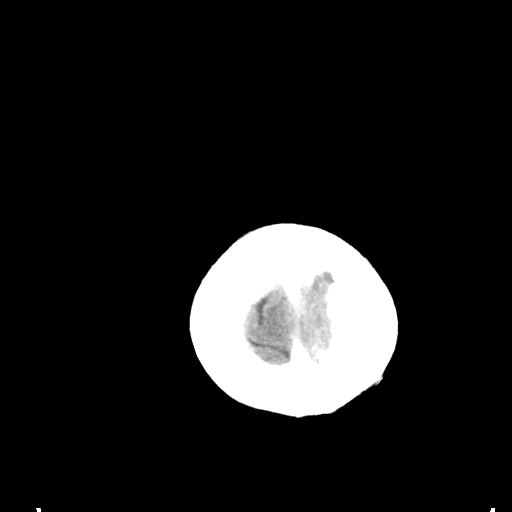
[im 29/30  brain]
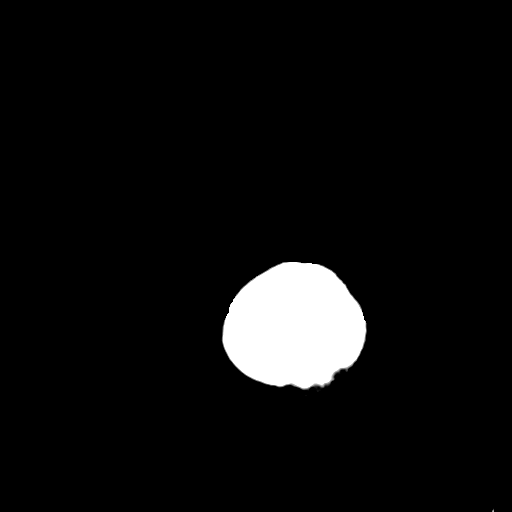

[15 of 30 positions shown; findings below may reference images not displayed]

FINDINGS: Focal hypoattenuation within the anterior limb of the
left internal capsule may represent an age indeterminate infarct.
This does not correspond with the lower left lower extremity
symptoms.

No acute cortical infarct is present otherwise.  No hemorrhage or
mass lesion is present.  The ventricles are of normal size.  No
significant extra-axial fluid collection is present.  Mild mucosal
thickening is present in the ethmoid air cells and inferior left
frontal sinus.  The paranasal sinuses and mastoid air cells are
otherwise clear.
IMPRESSION: 1.  Focal hypoattenuation within the anterior limb of the left
internal capsule may be related to ischemia but does not correspond
to the patient's acute symptoms.
2.  No other acute intracranial abnormality.
3.  Minimal left ethmoid and inferior left frontal sinus disease.

## 2017-06-26 DIAGNOSIS — I10 Essential (primary) hypertension: Secondary | ICD-10-CM | POA: Diagnosis not present

## 2018-02-04 DIAGNOSIS — L0201 Cutaneous abscess of face: Secondary | ICD-10-CM | POA: Diagnosis not present

## 2018-09-04 DIAGNOSIS — L738 Other specified follicular disorders: Secondary | ICD-10-CM | POA: Diagnosis not present

## 2018-09-06 DIAGNOSIS — L988 Other specified disorders of the skin and subcutaneous tissue: Secondary | ICD-10-CM | POA: Diagnosis not present

## 2018-10-08 DIAGNOSIS — L738 Other specified follicular disorders: Secondary | ICD-10-CM | POA: Diagnosis not present

## 2018-11-28 ENCOUNTER — Emergency Department (HOSPITAL_BASED_OUTPATIENT_CLINIC_OR_DEPARTMENT_OTHER)
Admission: EM | Admit: 2018-11-28 | Discharge: 2018-11-28 | Disposition: A | Discharge status: Home / Self Care | Payer: BC Managed Care – PPO | Attending: Emergency Medicine | Admitting: Emergency Medicine

## 2018-11-28 ENCOUNTER — Encounter (HOSPITAL_BASED_OUTPATIENT_CLINIC_OR_DEPARTMENT_OTHER): Payer: Self-pay

## 2018-11-28 ENCOUNTER — Other Ambulatory Visit: Payer: Self-pay

## 2018-11-28 ENCOUNTER — Emergency Department (HOSPITAL_BASED_OUTPATIENT_CLINIC_OR_DEPARTMENT_OTHER): Payer: BC Managed Care – PPO

## 2018-11-28 DIAGNOSIS — R519 Headache, unspecified: Secondary | ICD-10-CM | POA: Diagnosis not present

## 2018-11-28 DIAGNOSIS — I160 Hypertensive urgency: Secondary | ICD-10-CM | POA: Insufficient documentation

## 2018-11-28 DIAGNOSIS — Z7982 Long term (current) use of aspirin: Secondary | ICD-10-CM | POA: Diagnosis not present

## 2018-11-28 DIAGNOSIS — I10 Essential (primary) hypertension: Secondary | ICD-10-CM | POA: Diagnosis not present

## 2018-11-28 DIAGNOSIS — H538 Other visual disturbances: Secondary | ICD-10-CM | POA: Insufficient documentation

## 2018-11-28 DIAGNOSIS — Z8673 Personal history of transient ischemic attack (TIA), and cerebral infarction without residual deficits: Secondary | ICD-10-CM | POA: Diagnosis not present

## 2018-11-28 DIAGNOSIS — Z79899 Other long term (current) drug therapy: Secondary | ICD-10-CM | POA: Insufficient documentation

## 2018-11-28 HISTORY — DX: Essential (primary) hypertension: I10

## 2018-11-28 LAB — CBC WITH DIFFERENTIAL/PLATELET
Abs Immature Granulocytes: 0.02 10*3/uL (ref 0.00–0.07)
Basophils Absolute: 0 10*3/uL (ref 0.0–0.1)
Basophils Relative: 0 %
Eosinophils Absolute: 0.2 10*3/uL (ref 0.0–0.5)
Eosinophils Relative: 3 %
HCT: 45.5 % (ref 39.0–52.0)
Hemoglobin: 15.3 g/dL (ref 13.0–17.0)
Immature Granulocytes: 0 %
Lymphocytes Relative: 30 %
Lymphs Abs: 2.1 10*3/uL (ref 0.7–4.0)
MCH: 30.7 pg (ref 26.0–34.0)
MCHC: 33.6 g/dL (ref 30.0–36.0)
MCV: 91.4 fL (ref 80.0–100.0)
Monocytes Absolute: 0.6 10*3/uL (ref 0.1–1.0)
Monocytes Relative: 8 %
Neutro Abs: 4.2 10*3/uL (ref 1.7–7.7)
Neutrophils Relative %: 59 %
Platelets: 200 10*3/uL (ref 150–400)
RBC: 4.98 MIL/uL (ref 4.22–5.81)
RDW: 12.3 % (ref 11.5–15.5)
WBC: 7.1 10*3/uL (ref 4.0–10.5)
nRBC: 0 % (ref 0.0–0.2)

## 2018-11-28 LAB — URINALYSIS, ROUTINE W REFLEX MICROSCOPIC
Bilirubin Urine: NEGATIVE
Glucose, UA: NEGATIVE mg/dL
Ketones, ur: NEGATIVE mg/dL
Leukocytes,Ua: NEGATIVE
Nitrite: NEGATIVE
Protein, ur: NEGATIVE mg/dL
Specific Gravity, Urine: 1.01 (ref 1.005–1.030)
pH: 7 (ref 5.0–8.0)

## 2018-11-28 LAB — BASIC METABOLIC PANEL
Anion gap: 9 (ref 5–15)
BUN: 11 mg/dL (ref 8–23)
CO2: 27 mmol/L (ref 22–32)
Calcium: 9 mg/dL (ref 8.9–10.3)
Chloride: 99 mmol/L (ref 98–111)
Creatinine, Ser: 0.81 mg/dL (ref 0.61–1.24)
GFR calc Af Amer: >60 mL/min (ref >60)
GFR calc non Af Amer: >60 mL/min (ref >60)
Glucose, Bld: 123 mg/dL — ABNORMAL HIGH (ref 70–99)
Potassium: 3.9 mmol/L (ref 3.5–5.1)
Sodium: 135 mmol/L (ref 135–145)

## 2018-11-28 LAB — URINALYSIS, MICROSCOPIC (REFLEX): WBC, UA: NONE SEEN WBC/hpf (ref 0–5)

## 2018-11-28 NOTE — Discharge Instructions (Signed)
You were seen in the ED today for high blood pressure Your labwork and CT scan of your head were all very reassuring - you may be experiencing headaches due to elevations in your blood pressure Continue taking your blood pressure medication as prescribed until you can see your PCP as scheduled on Tuesday

## 2018-11-28 NOTE — ED Provider Notes (Signed)
[facility] DEPART[facility] Provider Note   CSN: [id number] Arrival date & time: 11/28/18  1801     History   Chief Complaint Chief Complaint  Patient presents with  . Hypertension    HPI Pau Banh is a 63 y.o. male with PMHx HTN (currently on antihypertensive although cannot recall name) who presents to the ED today complaining of increase in blood pressure for the past month.  Patient also reports intermittent headaches and blurry vision in his right eye for the past month although reports it was more severe today.  He states that his blood pressure today was 180/110 prompting the ED visit.  They do have a follow-up with her PCP next Tuesday.  Reports that he has not had any increase in his blood pressure medication in some years.  Does also endorse that he has been having some nosebleeds recently.  Per wife patient saw a specialist for the nosebleeds recently and it was thought that it was due to his high blood pressure pressure.  Denies fever, chills, neck stiffness, rash, speech difficulties, confusion, unilateral weakness or numbness, nausea, vomiting, any other associated symptoms.     The history is provided by the patient and the spouse. The history is limited by a language barrier.    Past Medical History:  Diagnosis Date  . Hypertension     Patient Active Problem List   Diagnosis Date Noted  . TIA (transient ischemic attack) 05/24/2012    History reviewed. No pertinent surgical history.      Home Medications    Prior to Admission medications   Medication Sig Start Date End Date Taking? Authorizing Provider  aspirin EC 81 MG EC tablet Take 1 tablet (81 mg total) by mouth daily. 05/25/12   Caren Griffins, MD    Family History No family history on file.  Social History Social History   Tobacco Use  . Smoking status: Never Smoker  . Smokeless tobacco: Never Used  Substance Use Topics  . Alcohol use: No  . Drug use: Never      Allergies   Patient has no known allergies.   Review of Systems Review of Systems  Constitutional: Negative for chills and fever.  HENT: Negative for congestion.   Eyes: Positive for visual disturbance.  Respiratory: Negative for cough and shortness of breath.   Cardiovascular: Negative for chest pain.  Gastrointestinal: Negative for abdominal pain, nausea and vomiting.  Genitourinary: Negative for difficulty urinating.  Musculoskeletal: Negative for neck pain and neck stiffness.  Skin: Negative for rash.  Neurological: Positive for headaches. Negative for syncope, weakness and numbness.     Physical Exam Updated Vital Signs BP (!) 167/95 (BP Location: Right Arm)   Pulse 68   Temp 98.9 F (37.2 C) (Oral)   Resp 14   Ht 5\' 6"  (1.676 m)   Wt 73.9 kg   SpO2 99%   BMI 26.31 kg/m   Physical Exam Vitals signs and nursing note reviewed.  Constitutional:      Appearance: He is not ill-appearing.  HENT:     Head: Normocephalic and atraumatic.     Right Ear: Tympanic membrane normal.     Left Ear: Tympanic membrane normal.  Eyes:     Extraocular Movements: Extraocular movements intact.     Conjunctiva/sclera: Conjunctivae normal.     Pupils: Pupils are equal, round, and reactive to light.     Comments: Visual Acuity Bilateral Distance:20/20 (Patient wears prescription glasses. Has them on at this  time.) R Distance:20/40 L Distance:20/20  Neck:     Musculoskeletal: Normal range of motion and neck supple. No neck rigidity.     Meningeal: Brudzinski's sign and Kernig's sign absent.  Cardiovascular:     Rate and Rhythm: Normal rate and regular rhythm.     Pulses: Normal pulses.  Pulmonary:     Effort: Pulmonary effort is normal.     Breath sounds: Normal breath sounds. No wheezing, rhonchi or rales.  Abdominal:     Palpations: Abdomen is soft.     Tenderness: There is no abdominal tenderness. There is no guarding or rebound.  Skin:    General: Skin is warm and dry.   Neurological:     Mental Status: He is alert.     Comments: CN 3-12 grossly intact A&O x4 GCS 15 Sensation and strength intact Gait nonataxic including with tandem walking Coordination with finger-to-nose WNL Neg romberg, neg pronator drift      ED Treatments / Results  Labs (all labs ordered are listed, but only abnormal results are displayed) Labs Reviewed  BASIC METABOLIC PANEL - Abnormal; Notable for the following components:      Result Value   Glucose, Bld 123 (*)    All other components within normal limits  URINALYSIS, ROUTINE W REFLEX MICROSCOPIC - Abnormal; Notable for the following components:   Hgb urine dipstick TRACE (*)    All other components within normal limits  URINALYSIS, MICROSCOPIC (REFLEX) - Abnormal; Notable for the following components:   Bacteria, UA RARE (*)    All other components within normal limits  CBC WITH DIFFERENTIAL/PLATELET    EKG None  Radiology Ct Head Wo Contrast  Result Date: 11/28/2018 CLINICAL DATA:  Headache, hypertension EXAM: CT HEAD WITHOUT CONTRAST TECHNIQUE: Contiguous axial images were obtained from the base of the skull through the vertex without intravenous contrast. COMPARISON:  CT head 05/24/2012 FINDINGS: Brain: No evidence of acute infarction, hemorrhage, hydrocephalus, extra-axial collection or mass lesion/mass effect. Symmetric mild prominence of the ventricles, cisterns and sulci compatible with parenchymal volume loss. Patchy areas of white matter hypoattenuation are most compatible with chronic microvascular angiopathy. Vascular: Atherosclerotic calcification of the carotid siphons. No hyperdense vessel. Skull: No calvarial fracture or suspicious osseous lesion. No scalp swelling or hematoma. Sinuses/Orbits: Paranasal sinuses and mastoid air cells are predominantly clear. Pneumatization of the petrous apices. Included orbital structures are unremarkable. Other: Non IMPRESSION: No acute intracranial abnormality.  Electronically Signed   By: Kreg Shropshire M.D.   On: 11/28/2018 20:47    Procedures Procedures (including critical care time)  Medications Ordered in ED Medications - No data to display   Initial Impression / Assessment and Plan / ED Course  I have reviewed the triage vital signs and the nursing notes.  Pertinent labs & imaging results that were available during my care of the patient were reviewed by me and considered in my medical decision making (see chart for details).    63 year old male who presents the ED today with complaints of elevated blood pressure intermittently for the past month, headaches, vision changes in the right eye.  Has been on same blood pressure medication for multiple years.  They are unable to say what kind of medication he is taking currently and unable to recognize any names.  They have a follow-up visit with her PCP on Tuesday.   Patient's blood pressure in the ED 167/95 although they report a reading of 180/110 today.  No focal neuro deficits on exam today.  Patient is afebrile without tachycardia or tachypnea.  There are no meningeal signs today.  Given complaint of blurry vision in right eye will obtain visual acuity.  Will obtain baseline screening labs and obtain CT head given complaint of headache and age above 1660 that is new for patient.   Visual acuity with decreased vision in right eye compared to left - it appears pt recently had his prescription changed prior to the blurry vision starting in right eye - have advised follow up with optometrist for recheck of prescription.   CT negative. Bloodwork reassuring today - no electrolyte derangements and normal creatinine. U/A without infection or dehydration. Will discharge patient home at this time - have discussed continuation of blood pressure medication until PCP follow up as scheduled on Tuesday. Strict return precautions have been discussed. Pt and wife are in agreement with plan. All questions were  answered prior to discharge.   This note was prepared using Dragon voice recognition software and may include unintentional dictation errors due to the inherent limitations of voice recognition software.        Final Clinical Impressions(s) / ED Diagnoses   Final diagnoses:  Essential hypertension  Hypertensive urgency    ED Discharge Orders    None       Tanda RockersVenter, Toriana Sponsel, PA-C 11/28/18 2252    Rolan BuccoBelfi, Melanie, MD 11/28/18 2321

## 2018-11-28 NOTE — ED Triage Notes (Addendum)
Pt has a hx of HTN. Pt reports a B/P of 180/110. Pt reports only symptoms of HA. Pt speaks "[place]" and has his wife in triage to translate.

## 2018-12-23 DIAGNOSIS — I10 Essential (primary) hypertension: Secondary | ICD-10-CM | POA: Diagnosis not present

## 2018-12-23 DIAGNOSIS — R197 Diarrhea, unspecified: Secondary | ICD-10-CM | POA: Diagnosis not present

## 2018-12-24 DIAGNOSIS — I10 Essential (primary) hypertension: Secondary | ICD-10-CM | POA: Diagnosis not present

## 2018-12-24 DIAGNOSIS — R197 Diarrhea, unspecified: Secondary | ICD-10-CM | POA: Diagnosis not present

## 2018-12-31 DIAGNOSIS — I10 Essential (primary) hypertension: Secondary | ICD-10-CM | POA: Diagnosis not present

## 2018-12-31 DIAGNOSIS — K219 Gastro-esophageal reflux disease without esophagitis: Secondary | ICD-10-CM | POA: Diagnosis not present

## 2018-12-31 DIAGNOSIS — Z20828 Contact with and (suspected) exposure to other viral communicable diseases: Secondary | ICD-10-CM | POA: Diagnosis not present

## 2018-12-31 DIAGNOSIS — R197 Diarrhea, unspecified: Secondary | ICD-10-CM | POA: Diagnosis not present

## 2019-04-14 ENCOUNTER — Emergency Department (HOSPITAL_COMMUNITY): Payer: Self-pay

## 2019-04-14 ENCOUNTER — Other Ambulatory Visit: Payer: Self-pay

## 2019-04-14 ENCOUNTER — Emergency Department (HOSPITAL_COMMUNITY)
Admission: EM | Admit: 2019-04-14 | Discharge: 2019-04-14 | Disposition: A | Discharge status: Home / Self Care | Payer: Self-pay | Attending: Emergency Medicine | Admitting: Emergency Medicine

## 2019-04-14 DIAGNOSIS — R0789 Other chest pain: Secondary | ICD-10-CM | POA: Insufficient documentation

## 2019-04-14 DIAGNOSIS — I10 Essential (primary) hypertension: Secondary | ICD-10-CM | POA: Insufficient documentation

## 2019-04-14 DIAGNOSIS — Z7982 Long term (current) use of aspirin: Secondary | ICD-10-CM | POA: Insufficient documentation

## 2019-04-14 LAB — TROPONIN I (HIGH SENSITIVITY)
Troponin I (High Sensitivity): 18 ng/L — ABNORMAL HIGH (ref <18)
Troponin I (High Sensitivity): 14 ng/L (ref <18)

## 2019-04-14 LAB — BASIC METABOLIC PANEL
Anion gap: 8 (ref 5–15)
BUN: 12 mg/dL (ref 8–23)
CO2: 21 mmol/L — ABNORMAL LOW (ref 22–32)
Calcium: 8.4 mg/dL — ABNORMAL LOW (ref 8.9–10.3)
Chloride: 105 mmol/L (ref 98–111)
Creatinine, Ser: 0.82 mg/dL (ref 0.61–1.24)
GFR calc Af Amer: >60 mL/min (ref >60)
GFR calc non Af Amer: >60 mL/min (ref >60)
Glucose, Bld: 126 mg/dL — ABNORMAL HIGH (ref 70–99)
Potassium: 4.2 mmol/L (ref 3.5–5.1)
Sodium: 134 mmol/L — ABNORMAL LOW (ref 135–145)

## 2019-04-14 LAB — D-DIMER, QUANTITATIVE: D-Dimer, Quant: 0.31 ug/mL-FEU (ref 0.00–0.50)

## 2019-04-14 LAB — CBC
HCT: 38.8 % — ABNORMAL LOW (ref 39.0–52.0)
Hemoglobin: 13 g/dL (ref 13.0–17.0)
MCH: 31 pg (ref 26.0–34.0)
MCHC: 33.5 g/dL (ref 30.0–36.0)
MCV: 92.6 fL (ref 80.0–100.0)
Platelets: 164 10*3/uL (ref 150–400)
RBC: 4.19 MIL/uL — ABNORMAL LOW (ref 4.22–5.81)
RDW: 12.3 % (ref 11.5–15.5)
WBC: 8.7 10*3/uL (ref 4.0–10.5)
nRBC: 0 % (ref 0.0–0.2)

## 2019-04-14 MED ORDER — ACETAMINOPHEN 500 MG PO TABS
1000.0000 mg | ORAL_TABLET | Freq: Once | ORAL | Status: AC
  Administered 2019-04-14: 05:00:00 1000 mg via ORAL
  Filled 2019-04-14: qty 2

## 2019-04-14 MED ORDER — MORPHINE SULFATE (PF) 4 MG/ML IV SOLN
4.0000 mg | Freq: Once | INTRAVENOUS | Status: AC
  Administered 2019-04-14: 02:00:00 4 mg via INTRAVENOUS
  Filled 2019-04-14: qty 1

## 2019-04-14 MED ORDER — PANTOPRAZOLE SODIUM 40 MG IV SOLR
40.0000 mg | Freq: Once | INTRAVENOUS | Status: AC
  Administered 2019-04-14: 04:00:00 40 mg via INTRAVENOUS
  Filled 2019-04-14: qty 40

## 2019-04-14 MED ORDER — ONDANSETRON HCL 4 MG/2ML IJ SOLN
4.0000 mg | Freq: Once | INTRAMUSCULAR | Status: AC
  Administered 2019-04-14: 02:00:00 4 mg via INTRAVENOUS
  Filled 2019-04-14: qty 2

## 2019-04-14 MED ORDER — ALUM & MAG HYDROXIDE-SIMETH 200-200-20 MG/5ML PO SUSP
30.0000 mL | Freq: Once | ORAL | Status: AC
  Administered 2019-04-14: 04:00:00 30 mL via ORAL
  Filled 2019-04-14: qty 30

## 2019-04-14 MED ORDER — LIDOCAINE VISCOUS HCL 2 % MT SOLN
15.0000 mL | Freq: Once | OROMUCOSAL | Status: AC
  Administered 2019-04-14: 04:00:00 15 mL via ORAL
  Filled 2019-04-14: qty 15

## 2019-04-14 MED ORDER — SODIUM CHLORIDE 0.9% FLUSH: 3.0000 mL | Freq: Once | INTRAVENOUS | Status: DC

## 2019-04-14 MED ORDER — KETOROLAC TROMETHAMINE 30 MG/ML IJ SOLN
30.0000 mg | Freq: Once | INTRAMUSCULAR | Status: AC
  Administered 2019-04-14: 05:00:00 30 mg via INTRAVENOUS
  Filled 2019-04-14: qty 1

## 2019-04-14 NOTE — ED Triage Notes (Signed)
Pt. BIB [facility] from home c/o CP starting at 9 pm yesterday while resting. Pt CP left side rating at 10 out of 10, no radiation, no cardiac HX, HX HTN. Additional S/S: SOB, pain when taking in a deep breath.   EMS gave 324 ASA and NTG X3. Pt states improvement of pain rating 4 out of 10.

## 2019-04-14 NOTE — ED Provider Notes (Signed)
TIME SEEN: 1:41 AM  CHIEF COMPLAINT: Chest pain  HPI: Patient is a 64 year old male with history of hypertension who presents to the emergency department with pressure-like left-sided chest pain without radiation.  Symptoms started at 7 PM tonight while at home at rest.  Pain initially was a 2/10 - 3/10.  Patient reports 2 h later pain increased to a 7/10.  He reports at 11 PM the pain became a 10/10 and he called 911.  EMS gave aspirin and 3 nitroglycerin tablets and pain is now a 4/10.  He states pain is worse with inspiration and he feels short of breath.  No nausea, vomiting, diaphoresis or dizziness.  No calf swelling or calf tenderness.  No history of diabetes, hyperlipidemia, tobacco use, CAD, PE or DVT.  States this does not feel like indigestion.  Reports he ate pizza for lunch and 2 biscuits and banana for dinner.  He states that he was already having the symptoms when he ate his dinner tonight.  States yesterday he was able to do strenuous activity in his yard and never had any chest pain or shortness of breath.  He denies any known injury to the chest.  He denies fevers, cough.  No known Covid exposures.  ROS: See HPI Constitutional: no fever  Eyes: no drainage  ENT: no runny nose   Cardiovascular:  chest pain  Resp:  SOB  GI: no vomiting GU: no dysuria Integumentary: no rash  Allergy: no hives  Musculoskeletal: no leg swelling  Neurological: no slurred speech ROS otherwise negative  PAST MEDICAL HISTORY/PAST SURGICAL HISTORY:  Past Medical History:  Diagnosis Date  . Hypertension     MEDICATIONS:  Prior to Admission medications   Medication Sig Start Date End Date Taking? Authorizing Provider  aspirin EC 81 MG EC tablet Take 1 tablet (81 mg total) by mouth daily. 05/25/12   Leatha Gilding, MD    ALLERGIES:  No Known Allergies  SOCIAL HISTORY:  Social History   Tobacco Use  . Smoking status: Never Smoker  . Smokeless tobacco: Never Used  Substance Use Topics  .  Alcohol use: No    FAMILY HISTORY: No family history on file.  EXAM: BP 124/86 (BP Location: Right Arm)   Pulse 63   Temp 98 F (36.7 C) (Oral)   Resp 10   Ht 5' 3.78" (1.62 m)   Wt 76.2 kg   SpO2 99%   BMI 29.04 kg/m  CONSTITUTIONAL: Alert and oriented and responds appropriately to questions. Well-appearing; well-nourished HEAD: Normocephalic EYES: Conjunctivae clear, pupils appear equal, EOM appear intact ENT: normal nose; moist mucous membranes NECK: Supple, normal ROM CARD: RRR; S1 and S2 appreciated; no murmurs, no clicks, no rubs, no gallops CHEST:  Chest wall is nontender to palpation.  No crepitus, ecchymosis, erythema, warmth, rash or other lesions present.   RESP: Normal chest excursion without splinting or tachypnea; breath sounds clear and equal bilaterally; no wheezes, no rhonchi, no rales, no hypoxia or respiratory distress, speaking full sentences ABD/GI: Normal bowel sounds; non-distended; soft, non-tender, no rebound, no guarding, no peritoneal signs, no hepatosplenomegaly BACK:  The back appears normal EXT: Normal ROM in all joints; no deformity noted, no edema; no cyanosis, no calf tenderness or calf swelling SKIN: Normal color for age and race; warm; no rash on exposed skin NEURO: Moves all extremities equally PSYCH: The patient's mood and manner are appropriate.   MEDICAL DECISION MAKING: Patient here with chest pain.  EKG shows mild elevation in  anterior leads which appears similar to previous.  His HEAR score is 3.  Will obtain cardiac labs including D-dimer given he is having some pleuritic component.  Will give morphine, Zofran to get patient chest pain-free.  ED PROGRESS: First troponin is eighteen.  D-dimer negative.  Chest x-ray clear.  No improvement in pain after morphine.  Will give GI cocktail, Protonix and reassess.  Second troponin pending.   4:30 AM  Pt's chest pain has not improved with GI cocktail and Protonix.  His second troponin is flat and  is 14.  This seems very atypical for ACS especially given he was able to exert himself with strenuous outdoor labor yesterday without any symptoms.  On my reevaluation, he now seems to have some left chest wall tenderness.  I suspect this is musculoskeletal in nature.  He reports morphine made him feel weird, dizzy.  Will give Toradol and reassess.   5:15 AM  Pt's chest pain improved with Toradol.  Again this seems very atypical and I feel he is safe to be discharged.  Recommended PCP follow-up.  Reports still having headache after nitroglycerin.  Will give Tylenol prior to discharge.  Vital signs within normal limits here.  Patient comfortable with this plan.   At this time, I do not feel there is any life-threatening condition present. I have reviewed, interpreted and discussed all results (EKG, imaging, lab, urine as appropriate) and exam findings with patient/family. I have reviewed nursing notes and appropriate previous records.  I feel the patient is safe to be discharged home without further emergent workup and can continue workup as an outpatient as needed. Discussed usual and customary return precautions. Patient/family verbalize understanding and are comfortable with this plan.  Outpatient follow-up has been provided as needed. All questions have been answered.    EKG Interpretation  Date/Time:  Monday April 14 2019 01:33:09 EST Ventricular Rate:  62 PR Interval:    QRS Duration: 87 QT Interval:  396 QTC Calculation: 403 R Axis:   50 Text Interpretation: Sinus rhythm Borderline ST elevation, anterior leads No significant change since last tracing since 2008 Confirmed by Regla Fitzgibbon, Cyril Mourning [id number]) on 04/14/2019 1:41:21 AM          Manuel Tyler was evaluated in Emergency Department on 04/14/2019 for the symptoms described in the history of present illness. He was evaluated in the context of the global COVID-19 pandemic, which necessitated consideration that the patient might be at risk  for infection with the SARS-CoV-2 virus that causes COVID-19. Institutional protocols and algorithms that pertain to the evaluation of patients at risk for COVID-19 are in a state of rapid change based on information released by regulatory bodies including the CDC and federal and state organizations. These policies and algorithms were followed during the patient's care in the ED.  Patient was seen wearing N95, face shield, gloves.    Euan Wandler, Delice Bison, DO 04/14/19 [phone]

## 2019-04-14 NOTE — Discharge Instructions (Addendum)
You may alternate Tylenol 1000 mg every 6 hours as needed for pain, fever and Ibuprofen 800 mg every 8 hours as needed for pain, fever.  Please take Ibuprofen with food.  Do not take more than 4000 mg of Tylenol (acetaminophen) in a 24 hour period.   Your cardiac labs, EKG, chest x-ray today were normal.  You also had a blood test called a D-dimer that was negative that rules out blood clots.  Your pain may be musculoskeletal in nature.  There is no sign of any life-threatening illness present today.  Please follow-up with your primary care physician.  If you have worsening symptoms of chest pain or shortness of breath, feel like you are going to pass out, begin suddenly sweating, high fever of 100.4 or higher, please return to the emergency department.

## 2019-05-14 DIAGNOSIS — K2970 Gastritis, unspecified, without bleeding: Secondary | ICD-10-CM | POA: Diagnosis not present

## 2019-05-16 DIAGNOSIS — R739 Hyperglycemia, unspecified: Secondary | ICD-10-CM | POA: Diagnosis not present

## 2019-06-09 DIAGNOSIS — K2970 Gastritis, unspecified, without bleeding: Secondary | ICD-10-CM | POA: Diagnosis not present

## 2019-06-09 DIAGNOSIS — B9681 Helicobacter pylori [H. pylori] as the cause of diseases classified elsewhere: Secondary | ICD-10-CM | POA: Diagnosis not present

## 2019-06-09 DIAGNOSIS — R197 Diarrhea, unspecified: Secondary | ICD-10-CM | POA: Diagnosis not present

## 2019-06-09 DIAGNOSIS — K219 Gastro-esophageal reflux disease without esophagitis: Secondary | ICD-10-CM | POA: Diagnosis not present

## 2019-08-20 DIAGNOSIS — Z1159 Encounter for screening for other viral diseases: Secondary | ICD-10-CM | POA: Diagnosis not present

## 2019-08-25 DIAGNOSIS — R1013 Epigastric pain: Secondary | ICD-10-CM | POA: Diagnosis not present

## 2019-08-25 DIAGNOSIS — R12 Heartburn: Secondary | ICD-10-CM | POA: Diagnosis not present

## 2019-08-25 DIAGNOSIS — R197 Diarrhea, unspecified: Secondary | ICD-10-CM | POA: Diagnosis not present

## 2019-08-25 DIAGNOSIS — K3189 Other diseases of stomach and duodenum: Secondary | ICD-10-CM | POA: Diagnosis not present

## 2019-09-11 ENCOUNTER — Ambulatory Visit (INDEPENDENT_AMBULATORY_CARE_PROVIDER_SITE_OTHER): Payer: Self-pay | Admitting: Otolaryngology

## 2019-09-11 ENCOUNTER — Other Ambulatory Visit: Payer: Self-pay

## 2019-09-16 ENCOUNTER — Ambulatory Visit (INDEPENDENT_AMBULATORY_CARE_PROVIDER_SITE_OTHER): Payer: BC Managed Care – PPO | Admitting: Otolaryngology

## 2019-09-16 ENCOUNTER — Encounter (INDEPENDENT_AMBULATORY_CARE_PROVIDER_SITE_OTHER): Payer: Self-pay | Admitting: Otolaryngology

## 2019-09-16 ENCOUNTER — Other Ambulatory Visit: Payer: Self-pay

## 2019-09-16 VITALS — Temp 97.2°F

## 2019-09-16 DIAGNOSIS — H6122 Impacted cerumen, left ear: Secondary | ICD-10-CM | POA: Diagnosis not present

## 2019-09-16 DIAGNOSIS — H60312 Diffuse otitis externa, left ear: Secondary | ICD-10-CM

## 2019-09-16 NOTE — Progress Notes (Signed)
HPI: Manuel Tyler is a 65 y.o. male who returns today for evaluation of left ear symptoms.  He has pain and discomfort and decreased hearing on the left side.  He had a tube placed in the left ear in 2018.  He is having no active drainage.  Past Medical History:  Diagnosis Date  . Hypertension    No past surgical history on file. Social History   Socioeconomic History  . Marital status: Married    Spouse name: Not on file  . Number of children: Not on file  . Years of education: Not on file  . Highest education level: Not on file  Occupational History  . Not on file  Tobacco Use  . Smoking status: Never Smoker  . Smokeless tobacco: Never Used  Vaping Use  . Vaping Use: Never used  Substance and Sexual Activity  . Alcohol use: No  . Drug use: Never  . Sexual activity: Not on file  Other Topics Concern  . Not on file  Social History Narrative  . Not on file   Social Determinants of Health   Financial Resource Strain:   . Difficulty of Paying Living Expenses:   Food Insecurity:   . Worried About Programme researcher, broadcasting/film/video in the Last Year:   . Barista in the Last Year:   Transportation Needs:   . Freight forwarder (Medical):   Marland Kitchen Lack of Transportation (Non-Medical):   Physical Activity:   . Days of Exercise per Week:   . Minutes of Exercise per Session:   Stress:   . Feeling of Stress :   Social Connections:   . Frequency of Communication with Friends and Family:   . Frequency of Social Gatherings with Friends and Family:   . Attends Religious Services:   . Active Member of Clubs or Organizations:   . Attends Banker Meetings:   Marland Kitchen Marital Status:    No family history on file. No Known Allergies Prior to Admission medications   Medication Sig Start Date End Date Taking? Authorizing Provider  aspirin EC 81 MG EC tablet Take 1 tablet (81 mg total) by mouth daily. 05/25/12  Yes Leatha Gilding, MD     Positive ROS: Otherwise negative  All  other systems have been reviewed and were otherwise negative with the exception of those mentioned in the HPI and as above.  Physical Exam: Constitutional: Alert, well-appearing, no acute distress Ears: External ears without lesions or tenderness.  Right ear canal and right TM are clear.  Left ear canal reveals swollen left ear canal consistent with external otitis along with an extruded myringotomy tube within the ear canal and debris that was cleaned with suction.  After cleaning the ear canal I applied Ciprodex eardrops to the left ear. Nasal: External nose without lesions. Septum with mild deformity and mild rhinitis.. Clear nasal passages Oral: Lips and gums without lesions. Tongue and palate mucosa without lesions. Posterior oropharynx clear. Neck: No palpable adenopathy or masses Respiratory: Breathing comfortably  Skin: No facial/neck lesions or rash noted.  Cerumen impaction removal  Date/Time: 09/16/2019 5:37 PM Performed by: Drema Halon, MD Authorized by: Drema Halon, MD   Consent:    Consent obtained:  Verbal   Consent given by:  Patient   Risks discussed:  Pain and bleeding Procedure details:    Location:  L ear   Procedure type: suction and forceps   Post-procedure details:    Inspection:  TM  intact and canal normal   Hearing quality:  Improved   Patient tolerance of procedure:  Tolerated well, no immediate complications Comments:     Left ear canal was cleaned with suction and forceps.  Patient had extruded left myringotomy tube as well as inflammatory changes of the ear canal.  After cleaning the ear canal I applied Ciprodex drops to the left ear.    Assessment: Extruded left myringotomy tube with acute left otitis externa  Plan: I applied Ciprodex drops in the office today and prescribed Ciprodex 4 to 5 drops left ear twice daily for 5 days and Augmentin 875 twice daily for 1 week.   Narda Bonds, MD

## 2019-09-30 ENCOUNTER — Ambulatory Visit (INDEPENDENT_AMBULATORY_CARE_PROVIDER_SITE_OTHER): Payer: Self-pay | Admitting: Otolaryngology

## 2019-10-06 ENCOUNTER — Other Ambulatory Visit: Payer: Self-pay

## 2019-10-06 ENCOUNTER — Ambulatory Visit (INDEPENDENT_AMBULATORY_CARE_PROVIDER_SITE_OTHER): Payer: BC Managed Care – PPO | Admitting: Otolaryngology

## 2019-10-06 ENCOUNTER — Encounter (INDEPENDENT_AMBULATORY_CARE_PROVIDER_SITE_OTHER): Payer: Self-pay | Admitting: Otolaryngology

## 2019-10-06 VITALS — Temp 97.2°F

## 2019-10-06 DIAGNOSIS — H60312 Diffuse otitis externa, left ear: Secondary | ICD-10-CM

## 2019-10-06 NOTE — Progress Notes (Signed)
HPI: Manuel Tyler is a 64 y.o. male who returns today for evaluation of painful and blocked left ear for the past week.  The ear canal was cleaned about 3 weeks ago and had slight inflammatory changes.  At that time he was treated with Augmentin and Ciprodex eardrops..  Past Medical History:  Diagnosis Date  . Hypertension    No past surgical history on file. Social History   Socioeconomic History  . Marital status: Married    Spouse name: Not on file  . Number of children: Not on file  . Years of education: Not on file  . Highest education level: Not on file  Occupational History  . Not on file  Tobacco Use  . Smoking status: Never Smoker  . Smokeless tobacco: Never Used  Vaping Use  . Vaping Use: Never used  Substance and Sexual Activity  . Alcohol use: No  . Drug use: Never  . Sexual activity: Not on file  Other Topics Concern  . Not on file  Social History Narrative  . Not on file   Social Determinants of Health   Financial Resource Strain:   . Difficulty of Paying Living Expenses:   Food Insecurity:   . Worried About Programme researcher, broadcasting/film/video in the Last Year:   . Barista in the Last Year:   Transportation Needs:   . Freight forwarder (Medical):   Marland Kitchen Lack of Transportation (Non-Medical):   Physical Activity:   . Days of Exercise per Week:   . Minutes of Exercise per Session:   Stress:   . Feeling of Stress :   Social Connections:   . Frequency of Communication with Friends and Family:   . Frequency of Social Gatherings with Friends and Family:   . Attends Religious Services:   . Active Member of Clubs or Organizations:   . Attends Banker Meetings:   Marland Kitchen Marital Status:    No family history on file. No Known Allergies Prior to Admission medications   Medication Sig Start Date End Date Taking? Authorizing Provider  aspirin EC 81 MG EC tablet Take 1 tablet (81 mg total) by mouth daily. 05/25/12  Yes Leatha Gilding, MD     Positive  ROS: Otherwise negative  All other systems have been reviewed and were otherwise negative with the exception of those mentioned in the HPI and as above.  Physical Exam: Constitutional: Alert, well-appearing, no acute distress Ears: External ears without lesions or tenderness.  Right ear canal and TM are clear.  Left ear canal is moderately inflamed with white debris as well as fungal elements in the left ear canal consistent with a fungal left external otitis.  Ear canal was copiously cleaned with hydroperoxide and alcohol after cleaning the ear canal I applied gentian violet, Ciprodex and CSF powder. Nasal: External nose without lesions.. Clear nasal passages Oral: Lips and gums without lesions. Tongue and palate mucosa without lesions. Posterior oropharynx clear. Neck: No palpable adenopathy or masses Respiratory: Breathing comfortably  Skin: No facial/neck lesions or rash noted.  Cerumen impaction removal  Date/Time: 10/06/2019 4:37 PM Performed by: Drema Halon, MD Authorized by: Drema Halon, MD   Consent:    Consent obtained:  Verbal   Consent given by:  Patient   Risks discussed:  Pain and bleeding Procedure details:    Location:  L ear   Procedure type: suction   Post-procedure details:    Inspection:  TM intact  Hearing quality:  Improved   Patient tolerance of procedure:  Tolerated well, no immediate complications Comments:     Right ear canal and TM are clear. Left ear canal is occluded with debris and fungal elements that was cleaned with suction.  Findings consistent with left external otitis.    Assessment: Left external otitis  Plan: Ear canal was cleaned in the office today and I applied gentian violet, Ciprodex and CSF powder.  He will follow-up in 5 days for recheck.  Recommend keeping the ear dry.   Narda Bonds, MD

## 2019-10-10 ENCOUNTER — Ambulatory Visit (INDEPENDENT_AMBULATORY_CARE_PROVIDER_SITE_OTHER): Payer: Self-pay | Admitting: Otolaryngology

## 2019-10-10 ENCOUNTER — Encounter (INDEPENDENT_AMBULATORY_CARE_PROVIDER_SITE_OTHER): Payer: Self-pay | Admitting: Otolaryngology

## 2019-10-10 ENCOUNTER — Other Ambulatory Visit: Payer: Self-pay

## 2019-10-10 VITALS — Temp 97.7°F

## 2019-10-10 DIAGNOSIS — H60312 Diffuse otitis externa, left ear: Secondary | ICD-10-CM

## 2019-10-10 NOTE — Progress Notes (Signed)
HPI: Manuel Tyler is a 64 y.o. male who returns today for evaluation of acute fungal left external otitis.  He is doing much better with no further pain and hearing is 75% better..  Past Medical History:  Diagnosis Date  . Hypertension    No past surgical history on file. Social History   Socioeconomic History  . Marital status: Married    Spouse name: Not on file  . Number of children: Not on file  . Years of education: Not on file  . Highest education level: Not on file  Occupational History  . Not on file  Tobacco Use  . Smoking status: Never Smoker  . Smokeless tobacco: Never Used  Vaping Use  . Vaping Use: Never used  Substance and Sexual Activity  . Alcohol use: No  . Drug use: Never  . Sexual activity: Not on file  Other Topics Concern  . Not on file  Social History Narrative  . Not on file   Social Determinants of Health   Financial Resource Strain:   . Difficulty of Paying Living Expenses:   Food Insecurity:   . Worried About Programme researcher, broadcasting/film/video in the Last Year:   . Barista in the Last Year:   Transportation Needs:   . Freight forwarder (Medical):   Marland Kitchen Lack of Transportation (Non-Medical):   Physical Activity:   . Days of Exercise per Week:   . Minutes of Exercise per Session:   Stress:   . Feeling of Stress :   Social Connections:   . Frequency of Communication with Friends and Family:   . Frequency of Social Gatherings with Friends and Family:   . Attends Religious Services:   . Active Member of Clubs or Organizations:   . Attends Banker Meetings:   Marland Kitchen Marital Status:    No family history on file. No Known Allergies Prior to Admission medications   Medication Sig Start Date End Date Taking? Authorizing Provider  aspirin EC 81 MG EC tablet Take 1 tablet (81 mg total) by mouth daily. 05/25/12  Yes Leatha Gilding, MD     Positive ROS: Otherwise negative  All other systems have been reviewed and were otherwise  negative with the exception of those mentioned in the HPI and as above.  Physical Exam: Constitutional: Alert, well-appearing, no acute distress Ears: External ears without lesions or tenderness.  Left ear canal with gentian violet within the ear canal and no fungal elements.  No significant swelling.  The TM is clear with gentian violet on it.  No drainage noted and no pain in the ear canal today.  I reapplied some gentian violet to the lateral portion ear canal and some CSF powder. Nasal: External nose without lesion. Clear nasal passages Oral: Lips and gums without lesions. Tongue and palate mucosa without lesions. Posterior oropharynx clear. Neck: No palpable adenopathy or masses Respiratory: Breathing comfortably  Skin: No facial/neck lesions or rash noted.  Procedures  Assessment: Resolving left external otitis  Plan: Recommend keeping the ear dry.  He has eardrops to use if he develops any further ear pain. He will follow-up as needed.   Narda Bonds, MD

## 2019-10-14 ENCOUNTER — Ambulatory Visit (INDEPENDENT_AMBULATORY_CARE_PROVIDER_SITE_OTHER): Payer: Self-pay | Admitting: Otolaryngology

## 2019-10-22 DIAGNOSIS — I10 Essential (primary) hypertension: Secondary | ICD-10-CM | POA: Diagnosis not present

## 2019-10-22 DIAGNOSIS — K2970 Gastritis, unspecified, without bleeding: Secondary | ICD-10-CM | POA: Diagnosis not present

## 2019-10-22 DIAGNOSIS — R7303 Prediabetes: Secondary | ICD-10-CM | POA: Diagnosis not present

## 2019-11-18 ENCOUNTER — Encounter (INDEPENDENT_AMBULATORY_CARE_PROVIDER_SITE_OTHER): Payer: Self-pay | Admitting: Otolaryngology

## 2019-11-18 ENCOUNTER — Other Ambulatory Visit: Payer: Self-pay

## 2019-11-18 ENCOUNTER — Ambulatory Visit (INDEPENDENT_AMBULATORY_CARE_PROVIDER_SITE_OTHER): Payer: BC Managed Care – PPO | Admitting: Otolaryngology

## 2019-11-18 VITALS — Temp 95.2°F

## 2019-11-18 DIAGNOSIS — H60312 Diffuse otitis externa, left ear: Secondary | ICD-10-CM | POA: Diagnosis not present

## 2019-11-18 NOTE — Progress Notes (Signed)
HPI: Manuel Tyler is a 64 y.o. male who returns today for evaluation of recurrent left external otitis.  He has been having drainage from his ear again.  He was last seen on 10/10/2019.Marland Kitchen  Past Medical History:  Diagnosis Date  . Hypertension    No past surgical history on file. Social History   Socioeconomic History  . Marital status: Married    Spouse name: Not on file  . Number of children: Not on file  . Years of education: Not on file  . Highest education level: Not on file  Occupational History  . Not on file  Tobacco Use  . Smoking status: Never Smoker  . Smokeless tobacco: Never Used  Vaping Use  . Vaping Use: Never used  Substance and Sexual Activity  . Alcohol use: No  . Drug use: Never  . Sexual activity: Not on file  Other Topics Concern  . Not on file  Social History Narrative  . Not on file   Social Determinants of Health   Financial Resource Strain:   . Difficulty of Paying Living Expenses: Not on file  Food Insecurity:   . Worried About Programme researcher, broadcasting/film/video in the Last Year: Not on file  . Ran Out of Food in the Last Year: Not on file  Transportation Needs:   . Lack of Transportation (Medical): Not on file  . Lack of Transportation (Non-Medical): Not on file  Physical Activity:   . Days of Exercise per Week: Not on file  . Minutes of Exercise per Session: Not on file  Stress:   . Feeling of Stress : Not on file  Social Connections:   . Frequency of Communication with Friends and Family: Not on file  . Frequency of Social Gatherings with Friends and Family: Not on file  . Attends Religious Services: Not on file  . Active Member of Clubs or Organizations: Not on file  . Attends Banker Meetings: Not on file  . Marital Status: Not on file   No family history on file. No Known Allergies Prior to Admission medications   Medication Sig Start Date End Date Taking? Authorizing Provider  aspirin EC 81 MG EC tablet Take 1 tablet (81 mg total)  by mouth daily. 05/25/12  Yes Leatha Gilding, MD     Positive ROS: Otherwise negative  All other systems have been reviewed and were otherwise negative with the exception of those mentioned in the HPI and as above.  Physical Exam: Constitutional: Alert, well-appearing, no acute distress Ears: External ears without lesions or tenderness.  Right ear canal right TM are clear.  Left ear canal reveals drainage with a medial external otitis with drainage and debris deep within the ear canal that was cleaned with suction.  After copiously irrigating and cleaning the ear with hydroperoxide and alcohol I applied gentian violet, Ciprodex and CSF powder to the left ear only. Nasal: External nose without lesions. Clear nasal passages Oral: Lips and gums without lesions. Tongue and palate mucosa without lesions. Posterior oropharynx clear. Neck: No palpable adenopathy or masses Respiratory: Breathing comfortably  Skin: No facial/neck lesions or rash noted.  Cerumen impaction removal  Date/Time: 11/18/2019 1:24 PM Performed by: Drema Halon, MD Authorized by: Drema Halon, MD   Consent:    Consent obtained:  Verbal   Consent given by:  Patient   Risks discussed:  Pain and bleeding Procedure details:    Location:  L ear   Procedure type: suction  Post-procedure details:    Inspection:  TM intact and canal normal   Hearing quality:  Improved   Patient tolerance of procedure:  Tolerated well, no immediate complications Comments:     Patient with debris and left external otitis.  After cleaning the ear canal I applied gentian violet, Ciprodex and CSF powder to the left ear.    Assessment: Chronic left external otitis  Plan: The left ear was cleaned in the office today.  I applied gentian violet Ciprodex and CSF powder to the left ear.  Recommended keeping it dry and he will follow-up in 1 week for recheck.   Narda Bonds, MD

## 2019-11-25 ENCOUNTER — Other Ambulatory Visit: Payer: Self-pay

## 2019-11-25 ENCOUNTER — Ambulatory Visit (INDEPENDENT_AMBULATORY_CARE_PROVIDER_SITE_OTHER): Payer: BC Managed Care – PPO | Admitting: Otolaryngology

## 2019-11-25 VITALS — Temp 97.5°F

## 2019-11-25 DIAGNOSIS — H60312 Diffuse otitis externa, left ear: Secondary | ICD-10-CM

## 2019-11-25 NOTE — Progress Notes (Signed)
HPI: Manuel Tyler is a 64 y.o. male who returns today for evaluation of left external otitis.  He has been doing well the past week.  Occasionally has some itching.  But no drainage..  Past Medical History:  Diagnosis Date  . Hypertension    No past surgical history on file. Social History   Socioeconomic History  . Marital status: Married    Spouse name: Not on file  . Number of children: Not on file  . Years of education: Not on file  . Highest education level: Not on file  Occupational History  . Not on file  Tobacco Use  . Smoking status: Never Smoker  . Smokeless tobacco: Never Used  Vaping Use  . Vaping Use: Never used  Substance and Sexual Activity  . Alcohol use: No  . Drug use: Never  . Sexual activity: Not on file  Other Topics Concern  . Not on file  Social History Narrative  . Not on file   Social Determinants of Health   Financial Resource Strain:   . Difficulty of Paying Living Expenses: Not on file  Food Insecurity:   . Worried About Programme researcher, broadcasting/film/video in the Last Year: Not on file  . Ran Out of Food in the Last Year: Not on file  Transportation Needs:   . Lack of Transportation (Medical): Not on file  . Lack of Transportation (Non-Medical): Not on file  Physical Activity:   . Days of Exercise per Week: Not on file  . Minutes of Exercise per Session: Not on file  Stress:   . Feeling of Stress : Not on file  Social Connections:   . Frequency of Communication with Friends and Family: Not on file  . Frequency of Social Gatherings with Friends and Family: Not on file  . Attends Religious Services: Not on file  . Active Member of Clubs or Organizations: Not on file  . Attends Banker Meetings: Not on file  . Marital Status: Not on file   No family history on file. No Known Allergies Prior to Admission medications   Medication Sig Start Date End Date Taking? Authorizing Provider  aspirin EC 81 MG EC tablet Take 1 tablet (81 mg total)  by mouth daily. 05/25/12   Leatha Gilding, MD     Positive ROS: Otherwise negative  All other systems have been reviewed and were otherwise negative with the exception of those mentioned in the HPI and as above.  Physical Exam: Constitutional: Alert, well-appearing, no acute distress Ears: External ears without lesions or tenderness.  Left ear canal is dry today with gentian violet intact.  No drainage noted and the TM is clear. Nasal: External nose without lesions.. Clear nasal passages Oral: Lips and gums without lesions. Tongue and palate mucosa without lesions. Posterior oropharynx clear. Neck: No palpable adenopathy or masses Respiratory: Breathing comfortably  Skin: No facial/neck lesions or rash noted.  Procedures  Assessment: Resolved left external otitis  Plan: Recommend keeping the ear dry and use the antibiotic eardrops if he has any itching in the ear and will follow up if he has any recurrent pain discomfort or drainage from the ear.   Narda Bonds, MD

## 2019-12-02 ENCOUNTER — Encounter (INDEPENDENT_AMBULATORY_CARE_PROVIDER_SITE_OTHER): Payer: Self-pay

## 2019-12-02 NOTE — Progress Notes (Unsigned)
Called into [facility] @ [facility]/Ciproflox&Dexometh. ear drops. Can be su. w/Floxin otic susp. Either, use 4 to 5 drops in ear w/pain for 5 days. 1 refill. Pt is having left ear pain. OK per Dr. Ezzard Standing.

## 2019-12-08 DIAGNOSIS — R197 Diarrhea, unspecified: Secondary | ICD-10-CM | POA: Diagnosis not present

## 2019-12-09 DIAGNOSIS — R197 Diarrhea, unspecified: Secondary | ICD-10-CM | POA: Diagnosis not present

## 2020-03-21 DIAGNOSIS — H2513 Age-related nuclear cataract, bilateral: Secondary | ICD-10-CM | POA: Diagnosis not present

## 2020-04-27 ENCOUNTER — Other Ambulatory Visit (INDEPENDENT_AMBULATORY_CARE_PROVIDER_SITE_OTHER): Payer: Self-pay

## 2020-04-27 ENCOUNTER — Telehealth (INDEPENDENT_AMBULATORY_CARE_PROVIDER_SITE_OTHER): Payer: Self-pay

## 2020-04-27 MED ORDER — CIPROFLOXACIN-DEXAMETHASONE 0.3-0.1 % OT SUSP: 4.0000 [drp] | Freq: Two times a day (BID) | OTIC | 0 refills | Status: DC

## 2020-04-27 NOTE — Telephone Encounter (Signed)
Pt called in with left ear pain and drainage. He wanted Cipro-dex. Ear drops called in to [facility]. This was ok w/Dr. Ezzard Standing. I called pt and informed him it was done.

## 2020-07-20 DIAGNOSIS — H2513 Age-related nuclear cataract, bilateral: Secondary | ICD-10-CM | POA: Diagnosis not present

## 2020-07-20 DIAGNOSIS — H18413 Arcus senilis, bilateral: Secondary | ICD-10-CM | POA: Diagnosis not present

## 2020-07-20 DIAGNOSIS — H25043 Posterior subcapsular polar age-related cataract, bilateral: Secondary | ICD-10-CM | POA: Diagnosis not present

## 2020-07-20 DIAGNOSIS — H25013 Cortical age-related cataract, bilateral: Secondary | ICD-10-CM | POA: Diagnosis not present

## 2020-07-20 DIAGNOSIS — H2511 Age-related nuclear cataract, right eye: Secondary | ICD-10-CM | POA: Diagnosis not present

## 2020-08-31 DIAGNOSIS — R197 Diarrhea, unspecified: Secondary | ICD-10-CM | POA: Diagnosis not present

## 2020-08-31 DIAGNOSIS — R7303 Prediabetes: Secondary | ICD-10-CM | POA: Diagnosis not present

## 2020-08-31 DIAGNOSIS — I10 Essential (primary) hypertension: Secondary | ICD-10-CM | POA: Diagnosis not present

## 2020-08-31 DIAGNOSIS — K219 Gastro-esophageal reflux disease without esophagitis: Secondary | ICD-10-CM | POA: Diagnosis not present

## 2020-09-21 DIAGNOSIS — R1013 Epigastric pain: Secondary | ICD-10-CM | POA: Diagnosis not present

## 2020-09-21 DIAGNOSIS — K219 Gastro-esophageal reflux disease without esophagitis: Secondary | ICD-10-CM | POA: Diagnosis not present

## 2020-09-21 DIAGNOSIS — R197 Diarrhea, unspecified: Secondary | ICD-10-CM | POA: Diagnosis not present

## 2020-09-23 DIAGNOSIS — R197 Diarrhea, unspecified: Secondary | ICD-10-CM | POA: Diagnosis not present

## 2020-12-15 DIAGNOSIS — E1169 Type 2 diabetes mellitus with other specified complication: Secondary | ICD-10-CM | POA: Diagnosis not present

## 2020-12-15 DIAGNOSIS — R29818 Other symptoms and signs involving the nervous system: Secondary | ICD-10-CM | POA: Diagnosis not present

## 2020-12-15 DIAGNOSIS — I10 Essential (primary) hypertension: Secondary | ICD-10-CM | POA: Diagnosis not present

## 2020-12-20 DIAGNOSIS — H2511 Age-related nuclear cataract, right eye: Secondary | ICD-10-CM | POA: Diagnosis not present

## 2020-12-21 DIAGNOSIS — H2512 Age-related nuclear cataract, left eye: Secondary | ICD-10-CM | POA: Diagnosis not present

## 2020-12-21 DIAGNOSIS — H2513 Age-related nuclear cataract, bilateral: Secondary | ICD-10-CM | POA: Diagnosis not present

## 2021-01-03 DIAGNOSIS — H2513 Age-related nuclear cataract, bilateral: Secondary | ICD-10-CM | POA: Diagnosis not present

## 2021-01-03 DIAGNOSIS — H2512 Age-related nuclear cataract, left eye: Secondary | ICD-10-CM | POA: Diagnosis not present

## 2021-02-03 DIAGNOSIS — D489 Neoplasm of uncertain behavior, unspecified: Secondary | ICD-10-CM | POA: Diagnosis not present

## 2021-02-03 DIAGNOSIS — Z23 Encounter for immunization: Secondary | ICD-10-CM | POA: Diagnosis not present

## 2021-02-03 DIAGNOSIS — L905 Scar conditions and fibrosis of skin: Secondary | ICD-10-CM | POA: Diagnosis not present

## 2021-02-03 DIAGNOSIS — L814 Other melanin hyperpigmentation: Secondary | ICD-10-CM | POA: Diagnosis not present

## 2021-03-21 DIAGNOSIS — E1169 Type 2 diabetes mellitus with other specified complication: Secondary | ICD-10-CM | POA: Diagnosis not present

## 2021-03-21 DIAGNOSIS — R42 Dizziness and giddiness: Secondary | ICD-10-CM | POA: Diagnosis not present

## 2021-03-21 DIAGNOSIS — I998 Other disorder of circulatory system: Secondary | ICD-10-CM | POA: Diagnosis not present

## 2021-03-22 DIAGNOSIS — R42 Dizziness and giddiness: Secondary | ICD-10-CM | POA: Diagnosis not present

## 2021-03-22 DIAGNOSIS — E1169 Type 2 diabetes mellitus with other specified complication: Secondary | ICD-10-CM | POA: Diagnosis not present

## 2021-03-24 NOTE — Progress Notes (Signed)
CARDIOLOGY CONSULT NOTE       Patient ID: Manuel Tyler MRN: [id number] DOB/AGE: Dec 19, 1955 66 y.o.  Admit date: (Not on file) Referring Physician: Rankin Primary Physician: [facility] Primary Cardiologist: New Reason for Consultation: BP  Active Problems:   * No active hospital problems. *   HPI:  66 y.o. history of gastritis, GERD, reflux, DM-2, and HTN.  Referred by primary Moshe Cipro FNP  for labile BP Patient feels his BP fluctuates and he has had some dizziness History of ear problems and Otitis in 2021 saw Dr Ezzard Standing Seen in ED 03/2019 with atypical chest pain Improved with GI cocktail and Toradol. R/O no acute ECG changes  CXR NAD Had an echo in 2014 for ? 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 Has 4 children all went to [employer]. Denies ETOH, Takes beta blocker in am ACE made BP too low    ROS All other systems reviewed and negative except as noted above  Past Medical History:  Diagnosis Date   Hypertension     No family history on file.  Social History   Socioeconomic History   Marital status: Married    Spouse name: Not on file   Number of children: Not on file   Years of education: Not on file   Highest education level: Not on file  Occupational History   Not on file  Tobacco Use   Smoking status: Never   Smokeless tobacco: Never  Vaping Use   Vaping Use: Never used  Substance and Sexual Activity   Alcohol use: No   Drug use: Never   Sexual activity: Not on file  Other Topics Concern   Not on file  Social History Narrative   Not on file   Social Determinants of Health   Financial Resource Strain: Not on file  Food  Insecurity: Not on file  Transportation Needs: Not on file  Physical Activity: Not on file  Stress: Not on file  Social Connections: Not on file  Intimate Partner Violence: Not on file    No past surgical history on file.    Current Outpatient Medications:    aspirin EC 81 MG EC tablet, Take 1 tablet (81 mg total) by mouth daily., Disp: 90 tablet, Rfl: 1   ciprofloxacin-dexamethasone (CIPRODEX) OTIC suspension, Place 4 drops into the left ear 2 (two) times daily., Disp: 7.5 mL, Rfl: 0    Physical Exam: There were no vitals taken for this visit.  Affect appropriate Healthy:  appears stated age HEENT: normal Neck supple with no adenopathy JVP normal no bruits no thyromegaly Lungs clear with no wheezing and good diaphragmatic motion Heart:  S1/S2 no murmur, no rub, gallop or click PMI normal Abdomen: benighn, BS positve, no tenderness, no AAA no bruit.  No HSM or HJR Distal pulses intact with no bruits No edema Neuro non-focal Skin warm and dry No muscular weakness   Labs:   Lab Results  Component Value Date   WBC 8.7 04/14/2019   HGB 13.0 04/14/2019   HCT 38.8 (L) 04/14/2019   MCV 92.6 04/14/2019   PLT 164 04/14/2019   No  results for input(s): NA, K, CL, CO2, BUN, CREATININE, CALCIUM, PROT, BILITOT, ALKPHOS, ALT, AST, GLUCOSE in the last 168 hours.  Invalid input(s): LABALBU Lab Results  Component Value Date   CKTOTAL 103 12/10/2006   CKMB 1.6 12/10/2006   TROPONINI <0.30 05/25/2012    Lab Results  Component Value Date   CHOL 204 (H) 05/25/2012   CHOL  12/10/2006    185        ATP III CLASSIFICATION:  <200     mg/dL   Desirable  638-756  mg/dL   Borderline High  >=433    mg/dL   High   Lab Results  Component Value Date   HDL 35 (L) 05/25/2012   HDL 27 (L) 12/10/2006   Lab Results  Component Value Date   LDLCALC 145 (H) 05/25/2012   LDLCALC (H) 12/10/2006    111        Total Cholesterol/HDL:CHD Risk Coronary Heart Disease Risk Table                      Men   Women  1/2 Average Risk   3.4   3.3   Lab Results  Component Value Date   TRIG 121 05/25/2012   TRIG 235 (H) 12/10/2006   Lab Results  Component Value Date   CHOLHDL 5.8 05/25/2012   CHOLHDL 6.9 12/10/2006   No results found for: LDLDIRECT    Radiology: No results found.  EKG: SR rate 62 normal 03/28/2021 SR rate 64 nonspecific ST changes    ASSESSMENT AND PLAN:   BP:  normal discussed fact that it can be labile and this is normal continue beta blocker take ACE only PRN  GI:  GERD, reflux, gastritis f/u OTC H2 blockers low carb diet DM-2:  Discussed low carb diet.  Target hemoglobin A1c is 6.5 or less.  Continue current medications. CAD:  risk discussed calcium scoring to risk stratify    Calcium Score PRN f/u   Signed: Charlton Haws 03/24/2021, 10:14 AM

## 2021-03-25 IMAGING — DX DG CHEST 2V
2 series · 2 of 2 positions shown · non-contrast
Comparison: Radiograph 12/09/2006

CLINICAL DATA: Chest pain, onset yesterday.

EXAM:
CHEST - 2 VIEW

[chest pa]
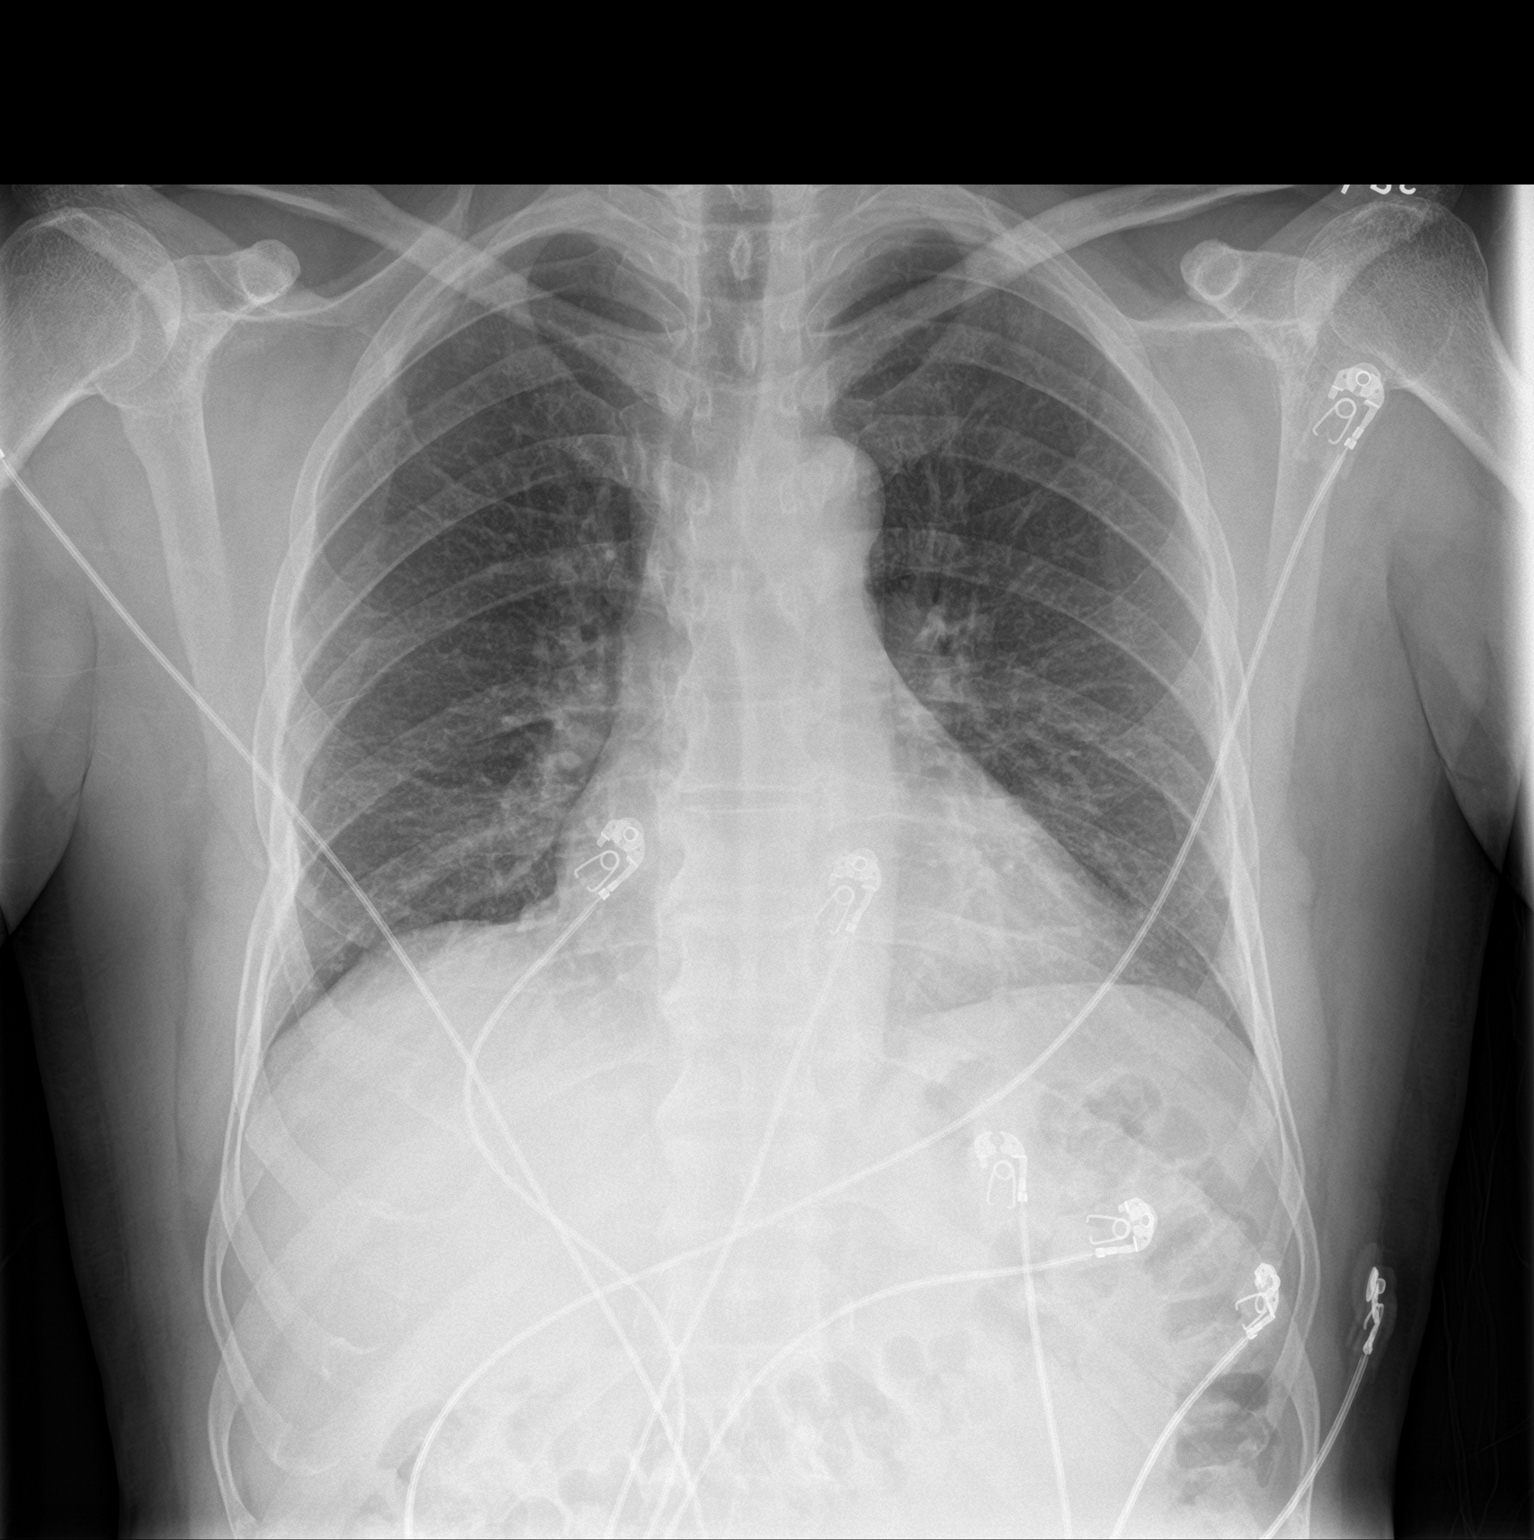

[chest lat]
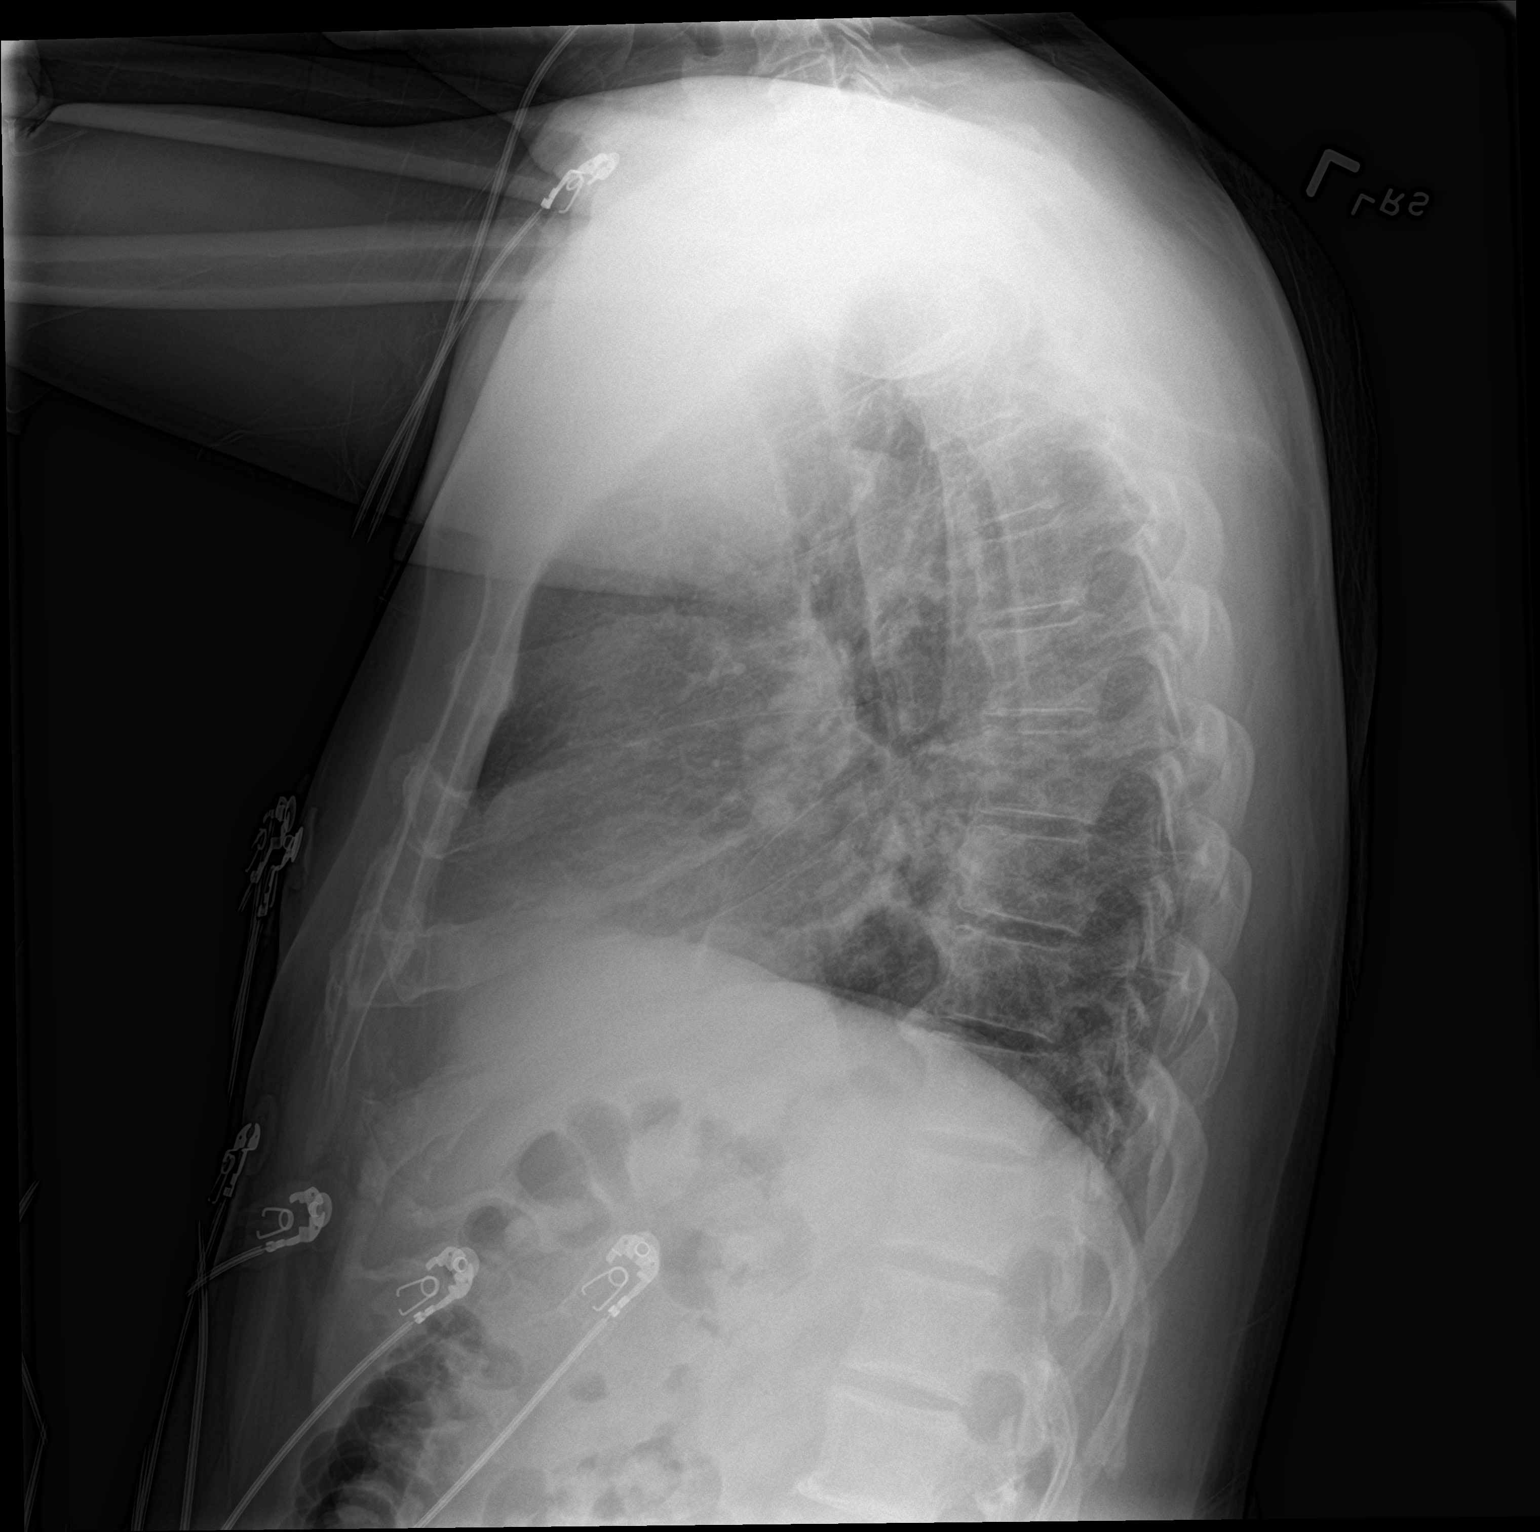

[2 of 2 positions shown; findings below may reference images not displayed]

FINDINGS: The cardiomediastinal contours are normal. The lungs are clear.
Pulmonary vasculature is normal. No consolidation, pleural effusion,
or pneumothorax. No acute osseous abnormalities are seen.
IMPRESSION: No acute chest findings.

## 2021-03-28 ENCOUNTER — Ambulatory Visit (INDEPENDENT_AMBULATORY_CARE_PROVIDER_SITE_OTHER)
Admission: RE | Admit: 2021-03-28 | Discharge: 2021-03-28 | Disposition: A | Discharge status: Home / Self Care | Payer: Self-pay | Source: Ambulatory Visit | Attending: Internal Medicine | Admitting: Internal Medicine

## 2021-03-28 ENCOUNTER — Ambulatory Visit (INDEPENDENT_AMBULATORY_CARE_PROVIDER_SITE_OTHER): Payer: Medicare Other | Admitting: Cardiovascular Disease

## 2021-03-28 ENCOUNTER — Other Ambulatory Visit: Payer: Self-pay

## 2021-03-28 ENCOUNTER — Encounter: Payer: Self-pay | Admitting: Cardiovascular Disease

## 2021-03-28 ENCOUNTER — Telehealth: Payer: Self-pay | Admitting: Cardiovascular Disease

## 2021-03-28 VITALS — BP 130/78 | HR 64 | Ht 63.0 in | Wt 174.0 lb

## 2021-03-28 DIAGNOSIS — K219 Gastro-esophageal reflux disease without esophagitis: Secondary | ICD-10-CM | POA: Diagnosis not present

## 2021-03-28 DIAGNOSIS — I10 Essential (primary) hypertension: Secondary | ICD-10-CM

## 2021-03-28 DIAGNOSIS — E118 Type 2 diabetes mellitus with unspecified complications: Secondary | ICD-10-CM

## 2021-03-28 NOTE — Telephone Encounter (Signed)
Tracey from [facility] calling with CT results. 

## 2021-03-28 NOTE — Patient Instructions (Addendum)
Medication Instructions:  Your physician recommends that you continue on your current medications as directed. Please refer to the Current Medication list given to you today.  *If you need a refill on your cardiac medications before your next appointment, please call your pharmacy*  Lab Work: If you have labs (blood work) drawn today and your tests are completely normal, you will receive your results only by: MyChart Message (if you have MyChart) OR A paper copy in the mail If you have any lab test that is abnormal or we need to change your treatment, we will call you to review the results.  Testing/Procedures: Cardiac CT scanning for calcium score, (CAT scanning), is a noninvasive, special x-ray that produces cross-sectional images of the body using x-rays and a computer. CT scans help physicians diagnose and treat medical conditions. For some CT exams, a contrast material is used to enhance visibility in the area of the body being studied. CT scans provide greater clarity and reveal more details than regular x-ray exams.  Follow-Up: At [facility], you and your health needs are our priority.  As part of our continuing mission to provide you with exceptional heart care, we have created designated Provider Care Teams.  These Care Teams include your primary Cardiologist (physician) and Advanced Practice Providers (APPs -  Physician Assistants and Nurse Practitioners) who all work together to provide you with the care you need, when you need it.  We recommend signing up for the patient portal called "MyChart".  Sign up information is provided on this After Visit Summary.  MyChart is used to connect with patients for Virtual Visits (Telemedicine).  Patients are able to view lab/test results, encounter notes, upcoming appointments, etc.  Non-urgent messages can be sent to your provider as well.   To learn more about what you can do with MyChart, go to [email or web address].    Your next  appointment:   As needed  The format for your next appointment:   In Person  Provider:   Charlton Haws, MD {

## 2021-03-28 NOTE — Telephone Encounter (Signed)
Routed Coronary Calcium Score results to PCP to address Over-read.

## 2021-03-28 NOTE — Telephone Encounter (Signed)
Manuel Tyler with [facility] called to report the following critical Calcium score findings:  " IMPRESSION: 1. Indeterminate pulmonary nodules, largest measuring 6 mm. Majority of the nodules appear to be pleural based or near the fissures. Evaluation of the lungs is limited due to motion artifact and incomplete imaging of the lungs. Non-contrast chest CT at 3-6 months is recommended. If the nodules are stable at time of repeat CT, then future CT at 18-24 months (from today's scan) is considered optional for low-risk patients, but is recommended for high-risk patients. This recommendation follows the consensus statement: Guidelines for Management of Incidental Pulmonary Nodules Detected on CT Images: From the Fleischner Society 2017; Radiology 2017; 284:228-243.   Please address.

## 2021-03-30 ENCOUNTER — Telehealth: Payer: Self-pay

## 2021-03-30 DIAGNOSIS — R918 Other nonspecific abnormal finding of lung field: Secondary | ICD-10-CM

## 2021-03-30 DIAGNOSIS — R931 Abnormal findings on diagnostic imaging of heart and coronary circulation: Secondary | ICD-10-CM

## 2021-03-30 NOTE — Telephone Encounter (Signed)
Placed order for lung nodule. Patient needs fasting lipid panel.

## 2021-03-30 NOTE — Telephone Encounter (Signed)
-----   Message from Josue Hector, MD sent at 03/28/2021  5:11 PM EST ----- Calcium score about average for age needs fasting lipids updated to see if Rx needed Needs f/u non contrast CT in 6 months for lung nodules

## 2021-04-01 ENCOUNTER — Ambulatory Visit: Payer: BC Managed Care – PPO | Admitting: Internal Medicine

## 2021-04-05 ENCOUNTER — Other Ambulatory Visit: Payer: Medicare Other

## 2021-05-31 DIAGNOSIS — Z03818 Encounter for observation for suspected exposure to other biological agents ruled out: Secondary | ICD-10-CM | POA: Diagnosis not present

## 2021-05-31 DIAGNOSIS — J209 Acute bronchitis, unspecified: Secondary | ICD-10-CM | POA: Diagnosis not present

## 2021-05-31 DIAGNOSIS — R051 Acute cough: Secondary | ICD-10-CM | POA: Diagnosis not present

## 2021-05-31 DIAGNOSIS — R519 Headache, unspecified: Secondary | ICD-10-CM | POA: Diagnosis not present

## 2021-05-31 DIAGNOSIS — J069 Acute upper respiratory infection, unspecified: Secondary | ICD-10-CM | POA: Diagnosis not present

## 2021-06-14 DIAGNOSIS — M67823 Other specified disorders of tendon, right elbow: Secondary | ICD-10-CM | POA: Diagnosis not present

## 2021-08-25 ENCOUNTER — Inpatient Hospital Stay: Admission: RE | Admit: 2021-08-25 | Payer: Medicare Other | Source: Ambulatory Visit

## 2021-08-26 DIAGNOSIS — H16143 Punctate keratitis, bilateral: Secondary | ICD-10-CM | POA: Diagnosis not present

## 2021-09-06 ENCOUNTER — Ambulatory Visit (HOSPITAL_BASED_OUTPATIENT_CLINIC_OR_DEPARTMENT_OTHER): Payer: Medicare Other

## 2021-10-07 ENCOUNTER — Telehealth: Payer: Self-pay | Admitting: Cardiovascular Disease

## 2021-10-07 DIAGNOSIS — R5383 Other fatigue: Secondary | ICD-10-CM | POA: Diagnosis not present

## 2021-10-07 DIAGNOSIS — I10 Essential (primary) hypertension: Secondary | ICD-10-CM | POA: Diagnosis not present

## 2021-10-07 DIAGNOSIS — E1165 Type 2 diabetes mellitus with hyperglycemia: Secondary | ICD-10-CM | POA: Diagnosis not present

## 2021-10-07 DIAGNOSIS — E1169 Type 2 diabetes mellitus with other specified complication: Secondary | ICD-10-CM | POA: Diagnosis not present

## 2021-10-07 DIAGNOSIS — G4710 Hypersomnia, unspecified: Secondary | ICD-10-CM | POA: Diagnosis not present

## 2021-10-07 NOTE — Telephone Encounter (Signed)
Wife called to cancel CT Chest test and requested a call back to clarification why patient needs this test.

## 2021-10-07 NOTE — Telephone Encounter (Signed)
Called patient back and informed him that the CT is to follow-up on lung nodules. Patient passed phone to his wife and informed her as well. They will call back to schedule CT.

## 2021-10-09 DIAGNOSIS — Z20822 Contact with and (suspected) exposure to covid-19: Secondary | ICD-10-CM | POA: Diagnosis not present

## 2021-10-10 ENCOUNTER — Ambulatory Visit (HOSPITAL_BASED_OUTPATIENT_CLINIC_OR_DEPARTMENT_OTHER): Payer: Medicare Other

## 2021-10-10 DIAGNOSIS — Z20822 Contact with and (suspected) exposure to covid-19: Secondary | ICD-10-CM | POA: Diagnosis not present

## 2021-10-10 DIAGNOSIS — U071 COVID-19: Secondary | ICD-10-CM | POA: Diagnosis not present

## 2021-11-08 DIAGNOSIS — I10 Essential (primary) hypertension: Secondary | ICD-10-CM | POA: Diagnosis not present

## 2021-11-08 DIAGNOSIS — E1169 Type 2 diabetes mellitus with other specified complication: Secondary | ICD-10-CM | POA: Diagnosis not present

## 2021-11-08 DIAGNOSIS — E559 Vitamin D deficiency, unspecified: Secondary | ICD-10-CM | POA: Diagnosis not present

## 2022-01-09 DIAGNOSIS — E559 Vitamin D deficiency, unspecified: Secondary | ICD-10-CM | POA: Diagnosis not present

## 2022-01-09 DIAGNOSIS — E1169 Type 2 diabetes mellitus with other specified complication: Secondary | ICD-10-CM | POA: Diagnosis not present

## 2022-01-09 DIAGNOSIS — K219 Gastro-esophageal reflux disease without esophagitis: Secondary | ICD-10-CM | POA: Diagnosis not present

## 2022-01-09 DIAGNOSIS — Z79899 Other long term (current) drug therapy: Secondary | ICD-10-CM | POA: Diagnosis not present

## 2022-01-09 DIAGNOSIS — I10 Essential (primary) hypertension: Secondary | ICD-10-CM | POA: Diagnosis not present

## 2022-03-08 DIAGNOSIS — K080 Exfoliation of teeth due to systemic causes: Secondary | ICD-10-CM | POA: Diagnosis not present

## 2022-03-29 DIAGNOSIS — K080 Exfoliation of teeth due to systemic causes: Secondary | ICD-10-CM | POA: Diagnosis not present

## 2022-04-11 DIAGNOSIS — I10 Essential (primary) hypertension: Secondary | ICD-10-CM | POA: Diagnosis not present

## 2022-04-11 DIAGNOSIS — Z79899 Other long term (current) drug therapy: Secondary | ICD-10-CM | POA: Diagnosis not present

## 2022-04-11 DIAGNOSIS — E1169 Type 2 diabetes mellitus with other specified complication: Secondary | ICD-10-CM | POA: Diagnosis not present

## 2022-04-11 DIAGNOSIS — E559 Vitamin D deficiency, unspecified: Secondary | ICD-10-CM | POA: Diagnosis not present

## 2022-06-14 DIAGNOSIS — R12 Heartburn: Secondary | ICD-10-CM | POA: Diagnosis not present

## 2022-06-14 DIAGNOSIS — K529 Noninfective gastroenteritis and colitis, unspecified: Secondary | ICD-10-CM | POA: Diagnosis not present

## 2022-07-17 DIAGNOSIS — K080 Exfoliation of teeth due to systemic causes: Secondary | ICD-10-CM | POA: Diagnosis not present

## 2022-08-02 DIAGNOSIS — K080 Exfoliation of teeth due to systemic causes: Secondary | ICD-10-CM | POA: Diagnosis not present

## 2022-09-21 DIAGNOSIS — Z0000 Encounter for general adult medical examination without abnormal findings: Secondary | ICD-10-CM | POA: Diagnosis not present

## 2022-09-27 DIAGNOSIS — I700 Atherosclerosis of aorta: Secondary | ICD-10-CM | POA: Diagnosis not present

## 2022-09-27 DIAGNOSIS — K219 Gastro-esophageal reflux disease without esophagitis: Secondary | ICD-10-CM | POA: Diagnosis not present

## 2022-09-27 DIAGNOSIS — I10 Essential (primary) hypertension: Secondary | ICD-10-CM | POA: Diagnosis not present

## 2022-09-27 DIAGNOSIS — E119 Type 2 diabetes mellitus without complications: Secondary | ICD-10-CM | POA: Diagnosis not present

## 2022-09-27 DIAGNOSIS — E1169 Type 2 diabetes mellitus with other specified complication: Secondary | ICD-10-CM | POA: Diagnosis not present

## 2022-09-27 DIAGNOSIS — E782 Mixed hyperlipidemia: Secondary | ICD-10-CM | POA: Diagnosis not present

## 2022-09-27 DIAGNOSIS — E559 Vitamin D deficiency, unspecified: Secondary | ICD-10-CM | POA: Diagnosis not present

## 2022-11-23 DIAGNOSIS — H52223 Regular astigmatism, bilateral: Secondary | ICD-10-CM | POA: Diagnosis not present

## 2022-11-27 DIAGNOSIS — H524 Presbyopia: Secondary | ICD-10-CM | POA: Diagnosis not present

## 2023-03-30 DIAGNOSIS — E1169 Type 2 diabetes mellitus with other specified complication: Secondary | ICD-10-CM | POA: Diagnosis not present

## 2023-03-30 DIAGNOSIS — E782 Mixed hyperlipidemia: Secondary | ICD-10-CM | POA: Diagnosis not present

## 2023-03-30 DIAGNOSIS — E559 Vitamin D deficiency, unspecified: Secondary | ICD-10-CM | POA: Diagnosis not present

## 2023-03-30 DIAGNOSIS — I10 Essential (primary) hypertension: Secondary | ICD-10-CM | POA: Diagnosis not present

## 2023-03-30 DIAGNOSIS — Z0000 Encounter for general adult medical examination without abnormal findings: Secondary | ICD-10-CM | POA: Diagnosis not present

## 2023-06-13 DIAGNOSIS — K080 Exfoliation of teeth due to systemic causes: Secondary | ICD-10-CM | POA: Diagnosis not present

## 2023-07-05 DIAGNOSIS — K080 Exfoliation of teeth due to systemic causes: Secondary | ICD-10-CM | POA: Diagnosis not present

## 2023-07-24 DIAGNOSIS — K080 Exfoliation of teeth due to systemic causes: Secondary | ICD-10-CM | POA: Diagnosis not present

## 2023-07-31 DIAGNOSIS — R030 Elevated blood-pressure reading, without diagnosis of hypertension: Secondary | ICD-10-CM | POA: Diagnosis not present

## 2023-07-31 DIAGNOSIS — M545 Low back pain, unspecified: Secondary | ICD-10-CM | POA: Diagnosis not present

## 2023-07-31 DIAGNOSIS — R34 Anuria and oliguria: Secondary | ICD-10-CM | POA: Diagnosis not present

## 2023-08-22 DIAGNOSIS — K080 Exfoliation of teeth due to systemic causes: Secondary | ICD-10-CM | POA: Diagnosis not present

## 2023-09-26 DIAGNOSIS — M545 Low back pain, unspecified: Secondary | ICD-10-CM | POA: Diagnosis not present

## 2023-10-02 DIAGNOSIS — E782 Mixed hyperlipidemia: Secondary | ICD-10-CM | POA: Diagnosis not present

## 2023-10-02 DIAGNOSIS — R252 Cramp and spasm: Secondary | ICD-10-CM | POA: Diagnosis not present

## 2023-10-02 DIAGNOSIS — E1169 Type 2 diabetes mellitus with other specified complication: Secondary | ICD-10-CM | POA: Diagnosis not present

## 2023-10-02 DIAGNOSIS — E559 Vitamin D deficiency, unspecified: Secondary | ICD-10-CM | POA: Diagnosis not present

## 2023-10-02 DIAGNOSIS — Z1331 Encounter for screening for depression: Secondary | ICD-10-CM | POA: Diagnosis not present

## 2023-10-02 DIAGNOSIS — Z0000 Encounter for general adult medical examination without abnormal findings: Secondary | ICD-10-CM | POA: Diagnosis not present

## 2023-11-12 DIAGNOSIS — G5622 Lesion of ulnar nerve, left upper limb: Secondary | ICD-10-CM | POA: Diagnosis not present

## 2023-11-12 DIAGNOSIS — I10 Essential (primary) hypertension: Secondary | ICD-10-CM | POA: Diagnosis not present

## 2024-01-30 DIAGNOSIS — J209 Acute bronchitis, unspecified: Secondary | ICD-10-CM | POA: Diagnosis not present

## 2024-02-12 DIAGNOSIS — J0100 Acute maxillary sinusitis, unspecified: Secondary | ICD-10-CM | POA: Diagnosis not present

## 2024-02-12 DIAGNOSIS — R053 Chronic cough: Secondary | ICD-10-CM | POA: Diagnosis not present

## 2024-02-12 DIAGNOSIS — R52 Pain, unspecified: Secondary | ICD-10-CM | POA: Diagnosis not present

## 2024-02-13 DIAGNOSIS — J3489 Other specified disorders of nose and nasal sinuses: Secondary | ICD-10-CM | POA: Diagnosis not present

## 2024-02-14 ENCOUNTER — Other Ambulatory Visit: Payer: Self-pay

## 2024-02-14 ENCOUNTER — Encounter (HOSPITAL_BASED_OUTPATIENT_CLINIC_OR_DEPARTMENT_OTHER): Payer: Self-pay

## 2024-02-14 ENCOUNTER — Emergency Department (HOSPITAL_BASED_OUTPATIENT_CLINIC_OR_DEPARTMENT_OTHER)
Admission: EM | Admit: 2024-02-14 | Discharge: 2024-02-14 | Disposition: A | Discharge status: Home / Self Care | Attending: Emergency Medicine | Admitting: Emergency Medicine

## 2024-02-14 ENCOUNTER — Emergency Department (HOSPITAL_BASED_OUTPATIENT_CLINIC_OR_DEPARTMENT_OTHER)

## 2024-02-14 DIAGNOSIS — Z7982 Long term (current) use of aspirin: Secondary | ICD-10-CM | POA: Diagnosis not present

## 2024-02-14 DIAGNOSIS — D72829 Elevated white blood cell count, unspecified: Secondary | ICD-10-CM | POA: Insufficient documentation

## 2024-02-14 DIAGNOSIS — J340 Abscess, furuncle and carbuncle of nose: Secondary | ICD-10-CM | POA: Diagnosis present

## 2024-02-14 LAB — CBC WITH DIFFERENTIAL/PLATELET
Abs Immature Granulocytes: 0.05 K/uL (ref 0.00–0.07)
Basophils Absolute: 0 K/uL (ref 0.0–0.1)
Basophils Relative: 0 %
Eosinophils Absolute: 0 K/uL (ref 0.0–0.5)
Eosinophils Relative: 0 %
HCT: 41.7 % (ref 39.0–52.0)
Hemoglobin: 14 g/dL (ref 13.0–17.0)
Immature Granulocytes: 0 %
Lymphocytes Relative: 14 %
Lymphs Abs: 1.9 K/uL (ref 0.7–4.0)
MCH: 31.3 pg (ref 26.0–34.0)
MCHC: 33.6 g/dL (ref 30.0–36.0)
MCV: 93.1 fL (ref 80.0–100.0)
Monocytes Absolute: 1.2 K/uL — ABNORMAL HIGH (ref 0.1–1.0)
Monocytes Relative: 9 %
Neutro Abs: 10.7 K/uL — ABNORMAL HIGH (ref 1.7–7.7)
Neutrophils Relative %: 77 %
Platelets: 165 K/uL (ref 150–400)
RBC: 4.48 MIL/uL (ref 4.22–5.81)
RDW: 12.5 % (ref 11.5–15.5)
WBC: 14 K/uL — ABNORMAL HIGH (ref 4.0–10.5)
nRBC: 0 % (ref 0.0–0.2)

## 2024-02-14 LAB — BASIC METABOLIC PANEL WITH GFR
Anion gap: 10 (ref 5–15)
BUN: 21 mg/dL (ref 8–23)
CO2: 27 mmol/L (ref 22–32)
Calcium: 9.8 mg/dL (ref 8.9–10.3)
Chloride: 99 mmol/L (ref 98–111)
Creatinine, Ser: 1.02 mg/dL (ref 0.61–1.24)
GFR, Estimated: >60 mL/min (ref >60)
Glucose, Bld: 196 mg/dL — ABNORMAL HIGH (ref 70–99)
Potassium: 4.3 mmol/L (ref 3.5–5.1)
Sodium: 135 mmol/L (ref 135–145)

## 2024-02-14 MED ORDER — DOXYCYCLINE HYCLATE 100 MG PO CAPS: 100.0000 mg | ORAL_CAPSULE | Freq: Two times a day (BID) | ORAL | 0 refills | Status: AC

## 2024-02-14 MED ORDER — DOXYCYCLINE HYCLATE 100 MG PO TABS
100.0000 mg | ORAL_TABLET | Freq: Once | ORAL | Status: AC
  Administered 2024-02-14: 19:00:00 100 mg via ORAL
  Filled 2024-02-14: qty 1

## 2024-02-14 MED ORDER — IOHEXOL 300 MG/ML  SOLN
75.0000 mL | Freq: Once | INTRAMUSCULAR | Status: AC | PRN
  Administered 2024-02-14: 18:00:00 75 mL via INTRAVENOUS

## 2024-02-14 NOTE — ED Provider Notes (Signed)
 " [place] EMERGENCY DEPARTMENT AT [facility] Provider Note   CSN: [id number] Arrival date & time: 02/14/24  1433     Patient presents with: Facial Swelling (nose)   Manuel Tyler is a 67 y.o. male presents today for redness and swelling to the nose that began on Monday.  Patient was seen on Tuesday and diagnosed with a possible infection.  Patient started on doxycycline  100mg  every day x 7 day yesterday and Augmentin BID x 10 days two days ago .  Patient reports headache from pressure and swelling.  Patient has been taking 2 tablets of 250 Tylenol  and 125 Motrin dual action pill.   HPI     Prior to Admission medications  Medication Sig Start Date End Date Taking? Authorizing Provider  amLODipine (NORVASC) 5 MG tablet Take 5 mg by mouth daily. 11/12/23  Yes [provider]  doxycycline  (VIBRAMYCIN ) 100 MG capsule Take 1 capsule (100 mg total) by mouth 2 (two) times daily. 02/14/24  Yes Kumar Falwell N, PA-C  metoprolol tartrate (LOPRESSOR) 50 MG tablet Take 50 mg by mouth 2 (two) times daily. 09/29/15  Yes [provider]  rosuvastatin (CRESTOR) 40 MG tablet Take 40 mg by mouth daily. 10/03/23  Yes [provider]  aspirin  EC 81 MG tablet Take 81 mg by mouth as needed. Swallow whole.    [provider]  metoprolol succinate (TOPROL-XL) 50 MG 24 hr tablet Take 50 mg by mouth daily. Take with or immediately following a meal.    [provider]    Allergies: Tetanus toxoid-containing vaccines    Review of Systems  Skin:  Positive for wound.    Updated Vital Signs BP (!) 163/85   Pulse 87   Temp 98.6 F (37 C)   Resp 16   SpO2 98%   Physical Exam Vitals and nursing note reviewed.  Constitutional:      General: He is not in acute distress.    Appearance: He is well-developed. He is not toxic-appearing.  HENT:     Head: Normocephalic and atraumatic.     Comments: Patient with diffuse swelling, erythema, and warmth to the soft  tissue of the nose with some spreading to the bilateral soft tissue adjacent to the nose.    Right Ear: External ear normal.     Left Ear: External ear normal.     Nose: No nasal deformity or septal deviation.     Mouth/Throat:     Pharynx: Uvula midline. No oropharyngeal exudate or uvula swelling.     Tonsils: No tonsillar abscesses.     Comments: No sublingual or submandibular swelling Eyes:     Conjunctiva/sclera: Conjunctivae normal.  Cardiovascular:     Rate and Rhythm: Normal rate and regular rhythm.     Heart sounds: No murmur heard. Pulmonary:     Effort: Pulmonary effort is normal. No respiratory distress.     Breath sounds: Normal breath sounds.  Abdominal:     Palpations: Abdomen is soft.     Tenderness: There is no abdominal tenderness.  Musculoskeletal:        General: No swelling.     Cervical back: Neck supple.  Skin:    General: Skin is warm and dry.     Capillary Refill: Capillary refill takes less than 2 seconds.  Neurological:     Mental Status: He is alert.  Psychiatric:        Mood and Affect: Mood normal.     (all labs ordered  are listed, but only abnormal results are displayed) Labs Reviewed  CBC WITH DIFFERENTIAL/PLATELET - Abnormal; Notable for the following components:      Result Value   WBC 14.0 (*)    Neutro Abs 10.7 (*)    Monocytes Absolute 1.2 (*)    All other components within normal limits  BASIC METABOLIC PANEL WITH GFR - Abnormal; Notable for the following components:   Glucose, Bld 196 (*)    All other components within normal limits    EKG: None  Radiology: CT Maxillofacial W Contrast Result Date: 02/14/2024 EXAM: CT Face with contrast 02/14/2024 06:10:58 PM TECHNIQUE: CT of the face was performed with the administration of 75 mL of iohexol  (OMNIPAQUE ) 300 MG/ML solution. Multiplanar reformatted images are provided for review. Automated exposure control, iterative reconstruction, and/or weight based adjustment of the mA/kV was  utilized to reduce the radiation dose to as low as reasonably achievable. COMPARISON: None available CLINICAL HISTORY: Nasal cellulitis, headache, history of diabetes mellitus, suspected deep space infection. FINDINGS: AERODIGESTIVE TRACT: There is an anterior nasal abscess measuring 0.6 x 1.0 x 2.6 cm with mild surrounding edema. No deep space extension. SALIVARY GLANDS: No acute abnormality. LYMPH NODES: No suspicious cervical lymphadenopathy. SOFT TISSUES: There is an anterior nasal abscess measuring 0.6 x 1.0 x 2.6 cm with mild surrounding edema. No deep space extension. BRAIN, ORBITS AND SINUSES: Mild mucosal thickening of the maxillary sinuses. BONES: No acute osseous abnormality or suspicious bone lesion. IMPRESSION: 1. Anterior nasal abscess measuring 0.6 x 1.0 x 2.6 cm with mild surrounding edema, without deep space extension. Electronically signed by: Franky Stanford MD 02/14/2024 06:44 PM EST RP Workstation: [id number]     Procedures   Medications Ordered in the ED  doxycycline  (VIBRA -TABS) tablet 100 mg (has no administration in time range)  iohexol  (OMNIPAQUE ) 300 MG/ML solution 75 mL (75 mLs Intravenous Contrast Given 02/14/24 1803)                                    Medical Decision Making Amount and/or Complexity of Data Reviewed Labs: ordered. Radiology: ordered.  Risk Prescription drug management.   lalThis patient presents to the ED for concern of nose swelling differential diagnosis includes sinusitis, cellulitis, abscess    Additional history obtained   Additional history obtained from Electronic Medical Record External records from outside source obtained and reviewed including Care Everywhere   Lab Tests:  I Ordered, and personally interpreted labs.  The pertinent results include: Leukocytosis of 14.0, BMP unremarkable   Imaging Studies ordered:  I ordered imaging studies including CT head maxillofacial with contrast I independently visualized and  interpreted imaging which showed anterior nasal abscess with mild surrounding edema, without deep space extension I agree with the radiologist interpretation   Medicines ordered and prescription drug management:  I ordered medication including Doxy    I have reviewed the patients home medicines and have made adjustments as needed   Problem List / ED Course:  Consults ENT, Dr. Masciello, who recommended starting the patient on doxycycline  twice daily and follow-up in clinic tomorrow. Considered for admission or further workup however patient's vital signs, physical exam, labs, and imaging are reassuring.  Patient started on doxycycline  twice daily and advised to follow-up with ENT as soon as possible.  Patient given return precautions.  I feel patient is safe for discharge at this time.       Final diagnoses:  Nasal abscess    ED Discharge Orders          Ordered    doxycycline  (VIBRAMYCIN ) 100 MG capsule  2 times daily        02/14/24 1900               Francis Ileana SAILOR, PA-C 02/14/24 1901    Pamella Ozell LABOR, DO 02/21/24 1248  "

## 2024-02-14 NOTE — ED Triage Notes (Signed)
 Pt c/o redness/ swelling to nose onset Monday. Seen by PCP Tuesday, dx w possible infection, reports compliance w abx w no relief/ improvement. Reports associated HA from swelling/ pressure, alternating tylenol / motrin

## 2024-02-14 NOTE — Discharge Instructions (Signed)
 Today you were seen for a nasal abscess.  Please pick up your doxycycline  and take as prescribed.  You may stop taking your Augmentin.  Dr. Greggory with ENT will call you tomorrow to have you come in for possible abscess drainage.  Thank you for letting us  treat you today. After reviewing your labs and imaging, I feel you are safe to go home. Please follow up with your PCP in the next several days and provide them with your records from this visit. Return to the Emergency Room if pain becomes severe or symptoms worsen.

## 2024-02-15 ENCOUNTER — Encounter (INDEPENDENT_AMBULATORY_CARE_PROVIDER_SITE_OTHER): Payer: Self-pay

## 2024-02-15 ENCOUNTER — Ambulatory Visit (INDEPENDENT_AMBULATORY_CARE_PROVIDER_SITE_OTHER)

## 2024-02-15 ENCOUNTER — Other Ambulatory Visit (HOSPITAL_COMMUNITY)
Admission: RE | Admit: 2024-02-15 | Discharge: 2024-02-15 | Disposition: A | Discharge status: Home / Self Care | Source: Ambulatory Visit

## 2024-02-15 VITALS — BP 138/87 | HR 87 | Ht 64.0 in | Wt 162.0 lb

## 2024-02-15 DIAGNOSIS — J340 Abscess, furuncle and carbuncle of nose: Secondary | ICD-10-CM | POA: Insufficient documentation

## 2024-02-15 MED ORDER — MUPIROCIN 2 % EX OINT: 1.0000 | TOPICAL_OINTMENT | Freq: Two times a day (BID) | CUTANEOUS | 0 refills | Status: AC

## 2024-02-15 NOTE — Progress Notes (Signed)
 Dear Dr. Doristine, Here is my assessment for our mutual patient, Manuel Tyler. Thank you for allowing me the opportunity to care for your patient. Please do not hesitate to contact me should you have any other questions. Sincerely, Dr. Hadassah Parody  Otolaryngology Clinic Note Referring provider: Dr. Doristine HPI:   Initial HPI (02/15/2024)  Discussed the use of AI scribe software for clinical note transcription with the patient, who gave verbal consent to proceed.  History of Present Illness Manuel Tyler is a 68 year old male with remote nasal surgery who presents with a one-week history of nasal abscess and worsening nasal pain.  Nasal abscess and pain - One week duration of nasal erythema and swelling, initially localized and subsequently enlarging - Pain intensity increased over several days, severe enough to disrupt sleep for two nights - Symptoms most pronounced on right side of his nose   He was seen in the ED last night.  Given antibiotics in the ED feeling a little bit better today.  CT showed a anterior nasal tip and septal abscess.   Independent Review of Additional Tests or Records:  CT face 02/14/24 independently reviewed showing anterior nasal abscess extending into the septum predominantly left side   PMH/Meds/All/SocHx/FamHx/ROS:   Past Medical History:  Diagnosis Date   Diarrhea    Gastritis    GERD (gastroesophageal reflux disease)    H. pylori infection    Hypertension    Type 2 diabetes mellitus (HCC)      No past surgical history on file.  No family history on file.   Social Connections: Not on file     Current Outpatient Medications  Medication Instructions   amLODipine (NORVASC) 5 mg, Daily   aspirin  EC 81 mg, As needed   doxycycline  (VIBRAMYCIN ) 100 mg, Oral, 2 times daily   metoprolol succinate (TOPROL-XL) 50 mg, Daily   metoprolol tartrate (LOPRESSOR) 50 mg, 2 times daily   mupirocin ointment (BACTROBAN) 2 % 1 Application, Topical, 2 times daily    rosuvastatin (CRESTOR) 40 mg, Daily     Physical Exam:   BP 138/87 (BP Location: Left Arm, Patient Position: Sitting)   Pulse 87   Ht 5' 4 (1.626 m)   Wt 162 lb (73.5 kg)   SpO2 95%   BMI 27.81 kg/m   Salient findings:  CN II-XII intact  Inflamed, erythematous and bulbous nasal tip, painful  Anterior rhinoscopy: Septum midline anteriorly with fullness on both sides No obviously palpable neck masses/lymphadenopathy/thyromegaly No respiratory distress or stridor  Seprately Identifiable Procedures:  Prior to initiating any procedures, risks/benefits/alternatives were explained to the patient and verbal consent obtained.  Procedure Note - (02/15/2024) Pre-procedure diagnosis: Concern for nasal tip and nasal septal abscess Post-procedure diagnosis: Same Procedure: Drainage of nasal septal, nasal tip abscess, CPT 30020 Complications: None apparent EBL: 3 mL  Indication: Manuel Tyler is a 68 y.o.  male with concern for nasal septal and nasal tip  abscess. Decision was made for incision and drainage of abscess following discussion of risks/benefits of procedure.  The patient was identified as the correct patient.  The patient is able to give consent, therefore consent was obtained. Surrounding skin was cleansed,  1% Lidocaine  with 1:100,000 epinephrine surrounding the area of fluctuance on both sides of the septum.   An 18-gauge needle was used to aspirate 5 cc of purulence from the right nasal septum and nasal dorsum.  This was sent for culture.  A stab incision was made in the area where the  purulence was taken.  No additional purulence was found after incising.  18-gauge needle was then used to aspirate any additional purulence from the left side.  No additional purulence was encountered.   The nose was then cleaned and dried.   Impression & Plans:  Manuel Tyler is a 68 y.o. male with   1. Nasal abscess    Nasal tip and nasal septal abscess - 5 cc aspirated today and  sent for culture. - Discussed with patient that he will require an intervention in the operating room if this does not improve after aspiration today and doxycycline  at home.  I will plan to call him on Monday morning to see how he is doing.  If he is doing worse, we will plan to go to the operating room on Tuesday for formal incision and drainage - Continue Doxy for 10 days as prescribed from the emergency department - Apply Bactroban ointment to nose - Okay to continue nasal saline rinses every morning - They will call if any worsening over the weekend  See below regarding exact medications prescribed this encounter including dosages and route: Meds ordered this encounter  Medications   mupirocin ointment (BACTROBAN) 2 %    Sig: Apply 1 Application topically 2 (two) times daily for 7 days.    Dispense:  14 g    Refill:  0     Thank you for allowing me the opportunity to care for your patient. Please do not hesitate to contact me should you have any other questions.  Sincerely, Hadassah Parody, MD Otolaryngologist (ENT), [facility] Phone: [phone] Fax: [phone]  MDM:  Level 4 Complexity/Problems addressed:  Data complexity: 4 independent review of CT - Morbidity: 4  - Prescription Drug prescribed or managed: yes

## 2024-02-18 ENCOUNTER — Telehealth (INDEPENDENT_AMBULATORY_CARE_PROVIDER_SITE_OTHER): Payer: Self-pay

## 2024-02-18 MED ORDER — METHYLPREDNISOLONE 4 MG PO TBPK: ORAL_TABLET | ORAL | 0 refills | Status: AC

## 2024-02-18 MED ORDER — DOXYCYCLINE HYCLATE 100 MG PO TABS: 100.0000 mg | ORAL_TABLET | Freq: Two times a day (BID) | ORAL | 0 refills | Status: AC

## 2024-02-18 NOTE — Telephone Encounter (Signed)
 Pt states today that his nose is feeling much better. Still a little swollen but he has had drainage from his nose. Redness is better and pain is better.  I will send more doxycycline  to finish 14 day course as well as medrol  pak.  They will call if he has any worsening but I am hopeful he will continue to improve. No need for surgery for drainage currently

## 2024-02-20 LAB — AEROBIC/ANAEROBIC CULTURE W GRAM STAIN (SURGICAL/DEEP WOUND)

## 2024-02-25 ENCOUNTER — Telehealth (INDEPENDENT_AMBULATORY_CARE_PROVIDER_SITE_OTHER): Payer: Self-pay

## 2024-02-25 NOTE — Telephone Encounter (Signed)
 Patient's wife called stated they finished the antibiotics and tried to get the second round but the pharmacy will not fill it until January 17. I spoke with Dr. Greggory she stated if patient is feeling better than the first round is okay. I explained to the patient's wife that it was okay and if he started to flare up again they could get the second round filled when pharmacy is able to. Patient and his wife understood.

## 2024-03-07 ENCOUNTER — Ambulatory Visit (INDEPENDENT_AMBULATORY_CARE_PROVIDER_SITE_OTHER)

## 2024-03-07 VITALS — BP 134/81 | HR 78

## 2024-03-07 DIAGNOSIS — J340 Abscess, furuncle and carbuncle of nose: Secondary | ICD-10-CM

## 2024-03-07 NOTE — Progress Notes (Signed)
 Dear Dr. Doristine, Here is my assessment for our mutual patient, Manuel Tyler. Thank you for allowing me the opportunity to care for your patient. Please do not hesitate to contact me should you have any other questions. Sincerely, Dr. Hadassah Parody  Otolaryngology Clinic Note Referring provider: Dr. Doristine HPI:   Initial HPI (02/15/24)  Manuel Tyler is a 69 year old male with remote nasal surgery who presents with a one-week history of nasal abscess and worsening nasal pain.  Nasal abscess and pain - One week duration of nasal erythema and swelling, initially localized and subsequently enlarging - Pain intensity increased over several days, severe enough to disrupt sleep for two nights - Symptoms most pronounced on right side of his nose   He was seen in the ED last night.  Given antibiotics in the ED feeling a little bit better today.  CT showed a anterior nasal tip and septal abscess.  --------------------------------------------------------- 03/07/2024  Presents for f/u after abscess drainage. He has done well and finished his antibiotics. Pain and swelling are significantly better. He still notices slight swelling on nasal bridge and some discomfort if he pushes on this area but nothing like it was before.    Independent Review of Additional Tests or Records:  CT face 02/14/24 independently reviewed showing anterior nasal abscess extending into the septum predominantly left side  Culture 02/15/2024 showing abundant MRSA sensitive to doxycycline  PMH/Meds/All/SocHx/FamHx/ROS:   Past Medical History:  Diagnosis Date   Diarrhea    Gastritis    GERD (gastroesophageal reflux disease)    H. pylori infection    Hypertension    Type 2 diabetes mellitus (HCC)      No past surgical history on file.  No family history on file.   Social Connections: Not on file     Current Outpatient Medications  Medication Instructions   amLODipine (NORVASC) 5 mg, Daily   aspirin  EC 81 mg, As  needed   doxycycline  (VIBRAMYCIN ) 100 mg, Oral, 2 times daily   methylPREDNISolone  (MEDROL  DOSEPAK) 4 MG TBPK tablet Follow instructions on packet   metoprolol succinate (TOPROL-XL) 50 mg, Daily   metoprolol tartrate (LOPRESSOR) 50 mg, 2 times daily   rosuvastatin (CRESTOR) 40 mg, Daily     Physical Exam:   BP 134/81 (BP Location: Left Arm, Patient Position: Sitting)   Pulse 78   SpO2 97%   Salient findings:  CN II-XII intact  Nasal tip no longer erythematous, swelling significantly improved.  Minimal swelling along nasal sidewalls that I expect will continue to improve.  Intranasal examination reveals healing mucosa No obviously palpable neck masses/lymphadenopathy/thyromegaly No respiratory distress or stridor  Seprately Identifiable Procedures:  Prior to initiating any procedures, risks/benefits/alternatives were explained to the patient and verbal consent obtained. none   Impression & Plans:  Manuel Tyler is a 69 y.o. male with   1. Nasal abscess     Nasal abscess, healing phase He is in the healing phase following a nasal abscess previously associated with MRSA. Persistent but mild swelling and pain are attributed to ongoing healing and possible scar tissue formation. Due to diabetes, risk of recurrence remains elevated. MRSA colonization may persist but does not require eradication in the absence of active infection. Good glycemic control is important to reduce recurrence risk. - No further antibiotics indicated at this time. - Advised that swelling may require several months to fully resolve. - Offered corticosteroids for swelling and pain; he declined as symptoms are tolerable. - Recommended continued nasal saline irrigations with  distilled water. - Approved continued use of Flonase as needed for allergic rhinitis. - Instructed to monitor for worsening pain or signs of infection and to notify if symptoms worsen. - Emphasized importance of optimal glycemic control to  reduce risk of recurrent infection.  I personally spent a total of 30 minutes in the care of the patient today including preparing to see the patient, obtaining history and examining patient and having a thorough discussion regarding MRSA infections, expectations and when to call to be seen again   See below regarding exact medications prescribed this encounter including dosages and route: No orders of the defined types were placed in this encounter.   Thank you for allowing me the opportunity to care for your patient. Please do not hesitate to contact me should you have any other questions.  Sincerely, Hadassah Parody, MD Otolaryngologist (ENT), [facility] Phone: [phone] Fax: [phone]
